# Patient Record
Sex: Male | Born: 1955
Health system: Southern US, Community
[De-identification: ages and names within clinical notes are randomized; demographics above are authoritative.]

## PROBLEM LIST (undated history)

## (undated) DIAGNOSIS — I10 Essential (primary) hypertension: Secondary | ICD-10-CM

## (undated) DIAGNOSIS — F17211 Nicotine dependence, cigarettes, in remission: Secondary | ICD-10-CM

## (undated) DIAGNOSIS — Z86010 Personal history of colonic polyps: Secondary | ICD-10-CM

## (undated) DIAGNOSIS — Z9109 Other allergy status, other than to drugs and biological substances: Secondary | ICD-10-CM

## (undated) DIAGNOSIS — N529 Male erectile dysfunction, unspecified: Secondary | ICD-10-CM

## (undated) DIAGNOSIS — R7303 Prediabetes: Secondary | ICD-10-CM

## (undated) DIAGNOSIS — E66811 Obesity, class 1: Secondary | ICD-10-CM

## (undated) DIAGNOSIS — F17200 Nicotine dependence, unspecified, uncomplicated: Secondary | ICD-10-CM

## (undated) DIAGNOSIS — K5792 Diverticulitis of intestine, part unspecified, without perforation or abscess without bleeding: Secondary | ICD-10-CM

## (undated) DIAGNOSIS — E669 Obesity, unspecified: Secondary | ICD-10-CM

## (undated) DIAGNOSIS — R7989 Other specified abnormal findings of blood chemistry: Secondary | ICD-10-CM

## (undated) DIAGNOSIS — E559 Vitamin D deficiency, unspecified: Secondary | ICD-10-CM

## (undated) DIAGNOSIS — E785 Hyperlipidemia, unspecified: Secondary | ICD-10-CM

## (undated) DIAGNOSIS — J449 Chronic obstructive pulmonary disease, unspecified: Secondary | ICD-10-CM

## (undated) DIAGNOSIS — T7840XA Allergy, unspecified, initial encounter: Secondary | ICD-10-CM

## (undated) DIAGNOSIS — G709 Myoneural disorder, unspecified: Secondary | ICD-10-CM

## (undated) HISTORY — DX: Myoneural disorder, unspecified: G70.9

## (undated) HISTORY — DX: Other allergy status, other than to drugs and biological substances: Z91.09

## (undated) HISTORY — DX: Essential (primary) hypertension: I10

## (undated) HISTORY — DX: Nicotine dependence, unspecified, uncomplicated: F17.200

## (undated) HISTORY — DX: Allergy, unspecified, initial encounter: T78.40XA

## (undated) HISTORY — DX: Hyperlipidemia, unspecified: E78.5

## (undated) HISTORY — DX: Obesity, unspecified: E66.9

## (undated) HISTORY — DX: Vitamin D deficiency, unspecified: E55.9

## (undated) HISTORY — DX: Prediabetes: R73.03

## (undated) HISTORY — PX: COLONOSCOPY: SHX174

## (undated) HISTORY — DX: Other specified abnormal findings of blood chemistry: R79.89

## (undated) HISTORY — DX: Obesity, class 1: E66.811

## (undated) HISTORY — DX: Chronic obstructive pulmonary disease, unspecified: J44.9

## (undated) HISTORY — DX: Nicotine dependence, cigarettes, in remission: F17.211

## (undated) HISTORY — DX: Diverticulitis of intestine, part unspecified, without perforation or abscess without bleeding: K57.92

## (undated) HISTORY — DX: Male erectile dysfunction, unspecified: N52.9

## (undated) HISTORY — PX: VASECTOMY: SHX75

---
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
## 2003-11-07 HISTORY — PX: COLON SURGERY: SHX602

## 2008-12-16 LAB — HM COLONOSCOPY

## 2008-12-18 HISTORY — PX: COLONOSCOPY: SHX174

## 2015-03-02 DIAGNOSIS — I10 Essential (primary) hypertension: Secondary | ICD-10-CM | POA: Insufficient documentation

## 2015-03-02 DIAGNOSIS — E7800 Pure hypercholesterolemia, unspecified: Secondary | ICD-10-CM | POA: Insufficient documentation

## 2016-09-02 LAB — VITAMIN B12: Vitamin B-12: 759

## 2017-05-29 DIAGNOSIS — E663 Overweight: Secondary | ICD-10-CM | POA: Insufficient documentation

## 2017-05-29 DIAGNOSIS — F17211 Nicotine dependence, cigarettes, in remission: Secondary | ICD-10-CM | POA: Insufficient documentation

## 2017-05-29 LAB — CBC AND DIFFERENTIAL
HCT: 44 (ref 41–53)
Hemoglobin: 15 (ref 13.5–17.5)
Platelets: 151 (ref 150–399)
WBC: 5.1

## 2017-05-29 LAB — BASIC METABOLIC PANEL
BUN: 11 (ref 4–21)
Creatinine: 0.8 (ref <=1.3)
Glucose: 90
Potassium: 4.5 (ref 3.4–5.3)
Sodium: 141 (ref 137–147)

## 2017-05-29 LAB — HEPATIC FUNCTION PANEL
ALT: 30 (ref 10–40)
AST: 22 (ref 14–40)
Alkaline Phosphatase: 55 (ref 25–125)
Bilirubin, Total: 0.7

## 2017-05-29 LAB — IRON,TIBC AND FERRITIN PANEL
Ferritin: 420
Iron: 96

## 2017-05-29 LAB — LIPID PANEL
Cholesterol: 175 (ref 0–200)
HDL: 52 (ref 35–70)
LDL Cholesterol: 97
Triglycerides: 128 (ref 40–160)

## 2017-05-29 LAB — HEMOGLOBIN A1C: Hemoglobin A1C: 5.8

## 2017-12-21 ENCOUNTER — Ambulatory Visit: Payer: PRIVATE HEALTH INSURANCE | Admitting: Family Medicine

## 2017-12-21 ENCOUNTER — Encounter: Payer: Self-pay | Admitting: Family Medicine

## 2017-12-21 VITALS — BP 140/77 | HR 75 | Temp 98.5°F | Resp 16 | Ht 67.0 in | Wt 200.0 lb

## 2017-12-21 DIAGNOSIS — Z0000 Encounter for general adult medical examination without abnormal findings: Secondary | ICD-10-CM | POA: Diagnosis not present

## 2017-12-21 DIAGNOSIS — E7800 Pure hypercholesterolemia, unspecified: Secondary | ICD-10-CM | POA: Diagnosis not present

## 2017-12-21 DIAGNOSIS — K5790 Diverticulosis of intestine, part unspecified, without perforation or abscess without bleeding: Secondary | ICD-10-CM | POA: Insufficient documentation

## 2017-12-21 DIAGNOSIS — I10 Essential (primary) hypertension: Secondary | ICD-10-CM | POA: Diagnosis not present

## 2017-12-21 DIAGNOSIS — N5201 Erectile dysfunction due to arterial insufficiency: Secondary | ICD-10-CM

## 2017-12-21 DIAGNOSIS — R7303 Prediabetes: Secondary | ICD-10-CM

## 2017-12-21 DIAGNOSIS — F17201 Nicotine dependence, unspecified, in remission: Secondary | ICD-10-CM | POA: Diagnosis not present

## 2017-12-21 DIAGNOSIS — E559 Vitamin D deficiency, unspecified: Secondary | ICD-10-CM | POA: Insufficient documentation

## 2017-12-21 DIAGNOSIS — R7301 Impaired fasting glucose: Secondary | ICD-10-CM | POA: Insufficient documentation

## 2017-12-21 LAB — CBC WITH DIFFERENTIAL/PLATELET
Basophils Absolute: 0 10*3/uL (ref 0.0–0.1)
Basophils Relative: 0.6 % (ref 0.0–3.0)
Eosinophils Absolute: 0.1 10*3/uL (ref 0.0–0.7)
Eosinophils Relative: 1.8 % (ref 0.0–5.0)
HCT: 42.3 % (ref 39.0–52.0)
Hemoglobin: 14.7 g/dL (ref 13.0–17.0)
Lymphocytes Relative: 36.1 % (ref 12.0–46.0)
Lymphs Abs: 2 10*3/uL (ref 0.7–4.0)
MCHC: 34.7 g/dL (ref 30.0–36.0)
MCV: 91.6 fl (ref 78.0–100.0)
Monocytes Absolute: 0.5 10*3/uL (ref 0.1–1.0)
Monocytes Relative: 9.2 % (ref 3.0–12.0)
Neutro Abs: 2.9 10*3/uL (ref 1.4–7.7)
Neutrophils Relative %: 52.3 % (ref 43.0–77.0)
Platelets: 171 10*3/uL (ref 150.0–400.0)
RBC: 4.62 Mil/uL (ref 4.22–5.81)
RDW: 13.6 % (ref 11.5–15.5)
WBC: 5.6 10*3/uL (ref 4.0–10.5)

## 2017-12-21 LAB — COMPREHENSIVE METABOLIC PANEL
ALT: 25 U/L (ref 0–53)
AST: 19 U/L (ref 0–37)
Albumin: 4.6 g/dL (ref 3.5–5.2)
Alkaline Phosphatase: 43 U/L (ref 39–117)
BUN: 13 mg/dL (ref 6–23)
CO2: 31 mEq/L (ref 19–32)
Calcium: 9.3 mg/dL (ref 8.4–10.5)
Chloride: 101 mEq/L (ref 96–112)
Creatinine, Ser: 0.88 mg/dL (ref 0.40–1.50)
GFR: 93.08 mL/min (ref >60.00)
Glucose, Bld: 100 mg/dL — ABNORMAL HIGH (ref 70–99)
Potassium: 4.5 mEq/L (ref 3.5–5.1)
Sodium: 140 mEq/L (ref 135–145)
Total Bilirubin: 1 mg/dL (ref 0.2–1.2)
Total Protein: 7.1 g/dL (ref 6.0–8.3)

## 2017-12-21 LAB — LIPID PANEL
Cholesterol: 192 mg/dL (ref 0–200)
HDL: 48.3 mg/dL (ref >39.00)
LDL Cholesterol: 110 mg/dL — ABNORMAL HIGH (ref 0–99)
NonHDL: 143.2
Total CHOL/HDL Ratio: 4
Triglycerides: 165 mg/dL — ABNORMAL HIGH (ref 0.0–149.0)
VLDL: 33 mg/dL (ref 0.0–40.0)

## 2017-12-21 LAB — HEMOGLOBIN A1C: Hgb A1c MFr Bld: 5.9 % (ref 4.6–6.5)

## 2017-12-21 LAB — TSH: TSH: 1.73 u[IU]/mL (ref 0.35–4.50)

## 2017-12-21 MED ORDER — TELMISARTAN 40 MG PO TABS: 40.0000 mg | ORAL_TABLET | Freq: Every day | ORAL | 3 refills | Status: DC

## 2017-12-21 MED ORDER — PRAVASTATIN SODIUM 40 MG PO TABS: 80.0000 mg | ORAL_TABLET | Freq: Every day | ORAL | 3 refills | Status: DC

## 2017-12-21 MED ORDER — SILDENAFIL CITRATE 20 MG PO TABS: ORAL_TABLET | ORAL | 6 refills | Status: AC

## 2017-12-21 NOTE — Progress Notes (Signed)
Office Note 12/21/2017  CC:  Chief Complaint  Patient presents with  . Establish Care  . Follow-up    RCI, needs medication refills   HPI:  Saw Todd Barry is a 62 y.o. male who is here to establish care and f/u HTN, HLD--needs med refills. Patient's most recent primary MD: Dr. Amedeo Plenty in [place]. Old records in EPIC/EMR were reviewed prior to or during today's visit.  HTN: no home monitoring.  Says BP is "great".  Takes med daily.  HLD: tolerating statin daily.  He reports few yrs hx of inability to get a complete erection, or loses erection quickly when he does get a normal erection.  Has tried a friend's viagra and says it helped and he had no side effect. He does not recall the dose he took.   Past Medical History:  Diagnosis Date  . Diverticulitis   . Hyperlipidemia   . Hypertension   . Obesity, Class I, BMI 30-34.9     Past Surgical History:  Procedure Laterality Date  . COLON SURGERY  2005   Partial resection (approx 1 foot per pt) for diverticulitis  Colonoscopy--last one approx 2014 Pt states it was normal). Last Td unknown.  Family History  Problem Relation Age of Onset  . Diabetes Mother   . Hypertension Mother   . Heart disease Father   . Heart attack Father     Social History   Socioeconomic History  . Marital status: Married    Spouse name: Not on file  . Number of children: Not on file  . Years of education: Not on file  . Highest education level: Not on file  Social Needs  . Financial resource strain: Not on file  . Food insecurity - worry: Not on file  . Food insecurity - inability: Not on file  . Transportation needs - medical: Not on file  . Transportation needs - non-medical: Not on file  Occupational History  . Not on file  Tobacco Use  . Smoking status: Former Smoker    Packs/day: 2.00    Years: 42.00    Pack years: 84.00    Types: Cigarettes    Last attempt to quit: 05/15/2017    Years since quitting: 0.6  . Smokeless  tobacco: Never Used  Substance and Sexual Activity  . Alcohol use: Yes    Comment: occiaionally  . Drug use: No  . Sexual activity: Not on file  Other Topics Concern  . Not on file  Social History Narrative   Married, 4 children---two in their 25s, 2 younger.   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   Tob: former smoker, 50 pack-yr hx.  Quit summer 2018.   Alc: none   Exercise: likes to run 2-3 miles on treadmill regularly, and play tennis.    Outpatient Encounter Medications as of 12/21/2017  Medication Sig  . aspirin 81 MG tablet Take 1 tablet by mouth daily.  Marland Kitchen FIBER PO Take 3 capsules by mouth daily.  Marland Kitchen ibuprofen (ADVIL,MOTRIN) 200 MG tablet Take 1 tablet by mouth every 6 (six) hours as needed.  Marland Kitchen MISC NATURAL PRODUCTS PO Take 1 capsule by mouth daily. Healthy Brain  . Omega-3 Fatty Acids (OMEGA-3 FISH OIL) 1200 MG CAPS Take 1 capsule by mouth 2 (two) times daily.  . pravastatin (PRAVACHOL) 40 MG tablet Take 2 tablets (80 mg total) by mouth daily.  Marland Kitchen telmisartan (MICARDIS) 40 MG tablet Take 1 tablet (40 mg total)  by mouth daily.  . [DISCONTINUED] pravastatin (PRAVACHOL) 40 MG tablet Take 2 tablets by mouth daily.  . [DISCONTINUED] telmisartan (MICARDIS) 40 MG tablet Take 1 tablet by mouth daily.  . sildenafil (REVATIO) 20 MG tablet 1-5 tabs po qd prn.  Take 30-60 min prior to intercourse   No facility-administered encounter medications on file as of 12/21/2017.     Allergies  Allergen Reactions  . Ace Inhibitors     Cough  . Penicillins Rash    ROS Review of Systems  Constitutional: Negative for fatigue and fever.  HENT: Negative for congestion and sore throat.   Eyes: Negative for visual disturbance.  Respiratory: Negative for cough.   Cardiovascular: Negative for chest pain.  Gastrointestinal: Negative for abdominal pain and nausea.  Genitourinary: Negative for dysuria.  Musculoskeletal: Negative for back pain and joint swelling.   Skin: Negative for rash.  Neurological: Negative for weakness and headaches.  Hematological: Negative for adenopathy.    PE; Blood pressure 140/77, pulse 75, temperature 98.5 F (36.9 C), temperature source Oral, resp. rate 16, height 5\' 7"  (1.702 m), weight 200 lb (90.7 kg), SpO2 90 %. Body mass index is 31.32 kg/m.  Gen: Alert, well appearing.  Patient is oriented to person, place, time, and situation. AFFECT: pleasant, lucid thought and speech. TFT:DDUK: no injection, icteris, swelling, or exudate.  EOMI, PERRLA. Mouth: lips without lesion/swelling.  Oral mucosa pink and moist. Oropharynx without erythema, exudate, or swelling.  CV: RRR, no m/r/g.   LUNGS: CTA bilat, nonlabored resps, good aeration in all lung fields. ABD: soft, NT/ND EXT: no clubbing, cyanosis, or edema.  SKIN: no rash, no jaundice, no pallor.  Pertinent labs:  No results found for: TSH No results found for: WBC, HGB, HCT, MCV, PLT No results found for: CREATININE, BUN, NA, K, CL, CO2 No results found for: ALT, AST, GGT, ALKPHOS, BILITOT No results found for: CHOL No results found for: HDL No results found for: LDLCALC No results found for: TRIG No results found for: CHOLHDL No results found for: PSA  ASSESSMENT AND PLAN:   New Pt: prior PCP's records in our EMR.  1) HTN: The current medical regimen is effective;  continue present plan and medications. Lytes/cr today. CBC and TSH today as well.  2) HLD: tolerating statin daily.  Check FLP and hepatic panel today.  3) Erectile dysfunction: viagra 20mg , 1-5 qd prn, #100 (quantity per pt's request). Therapeutic expectations and side effect profile of medication discussed today.  Patient's questions answered.  4) hx of tobacco dependence: quit approx 6-8 mo ago.  Has put on some wt since quitting BUT feels MUCH improved breathing with exercise (running/tennis).  5) Prediabetes: he is trying to adjust diet some, is exercising regularly. Checking  fasting glucose and A1c today.  6) Preventative health care:  Pt states he had a polyp-free colonoscopy about 4 yrs ago. Pt declines flu vaccine. Unknown date of last Td.  An After Visit Summary was printed and given to the patient.  Return in about 6 months (around 06/20/2018) for annual CPE (fasting).  Signed:  Crissie Sickles, MD           12/21/2017

## 2018-01-22 ENCOUNTER — Encounter: Payer: Self-pay | Admitting: Family Medicine

## 2018-01-24 ENCOUNTER — Encounter: Payer: Self-pay | Admitting: *Deleted

## 2018-01-24 ENCOUNTER — Encounter: Payer: Self-pay | Admitting: Family Medicine

## 2018-01-25 ENCOUNTER — Telehealth: Payer: Self-pay | Admitting: *Deleted

## 2018-01-25 ENCOUNTER — Encounter: Payer: Self-pay | Admitting: Family Medicine

## 2018-01-25 NOTE — Telephone Encounter (Signed)
Received medical records from [facility].  I reviewed records and abstracted information into pts chart.   Records have been placed on Dr. Idelle Leech desk for review.

## 2018-01-29 ENCOUNTER — Ambulatory Visit: Payer: PRIVATE HEALTH INSURANCE | Admitting: Family Medicine

## 2018-01-29 ENCOUNTER — Encounter: Payer: Self-pay | Admitting: Family Medicine

## 2018-01-29 VITALS — BP 107/74 | HR 58 | Temp 98.4°F | Resp 16 | Ht 67.0 in | Wt 199.4 lb

## 2018-01-29 DIAGNOSIS — H8110 Benign paroxysmal vertigo, unspecified ear: Secondary | ICD-10-CM | POA: Diagnosis not present

## 2018-01-29 MED ORDER — MECLIZINE HCL 25 MG PO TABS: 25.0000 mg | ORAL_TABLET | Freq: Three times a day (TID) | ORAL | 1 refills | Status: DC | PRN

## 2018-01-29 NOTE — Progress Notes (Signed)
OFFICE VISIT  01/29/2018   CC:  Chief Complaint  Patient presents with  . Dizziness    lasted for about 2.5 days, was seen at an urgent care, Rxed meclizine     HPI:    Patient is a 62 y.o. Caucasian male presents accompanied by his wife complaint of "dizziness". One week ago was feeling in normal state of health, was in [place] visiting, felt onset of distinct vertigo sensation. Was spontaneous, no known preceding head turning that triggered it.  Unclear how long the actual vertigo sensation lasted--sounds like a minute or two of signif sensation, then abated to a disequilibrium sensation..  Says some form of dizzy feeling lasted hours and then to the next day.  No nausea.  Went to UC next day, says no dx given. Still feeling some heavy headed feeling in the days since.  No hearing loss or ringing in ears.  "Fuzzy feeling in head". No further episodes of distinct vertigo. No fall.  No CP, SOB, diaphoresis.  No focal weakness, no slurred speech.  No palpitations.  BP at urgent care normal per pt report.  [facility] at [facility] (reviewed by me today) showed sinus rhythm, rate 65, isolated low voltage and inverted T wave in III, low voltage aVF.  No acute ischemic changes, no hypertrophy, no ectopy.  Intervals and duration normal.  No prior EKG available for comparison. No bp taken during this time except yesterday: 132/78.  Has been working out for the last mo w/out CP.  In [place] he walked a lot.  He was in [place] for work--+stressful.  Past Medical History:  Diagnosis Date  . Diverticulitis   . ED (erectile dysfunction)   . Elevated ferritin   . Hyperlipidemia   . Hypertension   . Nicotine dependence, cigarettes, in remission   . Obesity, Class I, BMI 30-34.9   . Prediabetes    A1c 5.9% 2019  . Vitamin D deficiency     Past Surgical History:  Procedure Laterality Date  . COLON SURGERY  2005   Partial resection (approx 1 foot per pt) for diverticulitis  . COLONOSCOPY  12/18/2008  . VASECTOMY       Outpatient Medications Prior to Visit  Medication Sig Dispense Refill  . aspirin 81 MG tablet Take 1 tablet by mouth daily.    Marland Kitchen FIBER PO Take 3 capsules by mouth daily.    Marland Kitchen ibuprofen (ADVIL,MOTRIN) 200 MG tablet Take 1 tablet by mouth every 6 (six) hours as needed.    Marland Kitchen MISC NATURAL PRODUCTS PO Take 1 capsule by mouth daily. Healthy Brain    . Omega-3 Fatty Acids (OMEGA-3 FISH OIL) 1200 MG CAPS Take 1 capsule by mouth 2 (two) times daily.    . pravastatin (PRAVACHOL) 40 MG tablet Take 2 tablets (80 mg total) by mouth daily. 180 tablet 3  . sildenafil (REVATIO) 20 MG tablet 1-5 tabs po qd prn.  Take 30-60 min prior to intercourse 100 tablet 6  . telmisartan (MICARDIS) 40 MG tablet Take 1 tablet (40 mg total) by mouth daily. 90 tablet 3   No facility-administered medications prior to visit.     Allergies  Allergen Reactions  . Ace Inhibitors     Cough  . Penicillins Rash    ROS As per HPI  PE: Blood pressure 107/74, pulse (!) 58, temperature 98.4 F (36.9 C), temperature source Oral, resp. rate 16, height 5\' 7"  (1.702 m), weight 199 lb 6 oz (90.4 kg), SpO2 97 %. Gen: Alert,  well appearing.  Patient is oriented to person, place, time, and situation. AFFECT: pleasant, lucid thought and speech. DSK:AJGO: no injection, icteris, swelling, or exudate.  EOMI, PERRLA. Mouth: lips without lesion/swelling.  Oral mucosa pink and moist. Oropharynx without erythema, exudate, or swelling.  CV: RRR, no m/r/g.   LUNGS: CTA bilat, nonlabored resps, good aeration in all lung fields. EXT: no clubbing, cyanosis, or edema.  Neuro: CN 2-12 intact bilaterally, strength 5/5 in proximal and distal upper extremities and lower extremities bilaterally. No tremor.  No disdiadochokinesis.  No ataxia.  Upper extremity and lower extremity DTRs symmetric.  No pronator drift. Dix-Halpike: no vertigo or nystagmus elicited at all.   LABS:    Chemistry      Component Value Date/Time   NA 140 12/21/2017  1347   NA 141 05/29/2017   K 4.5 12/21/2017 1347   CL 101 12/21/2017 1347   CO2 31 12/21/2017 1347   BUN 13 12/21/2017 1347   BUN 11 05/29/2017   CREATININE 0.88 12/21/2017 1347   GLU 90 05/29/2017      Component Value Date/Time   CALCIUM 9.3 12/21/2017 1347   ALKPHOS 43 12/21/2017 1347   AST 19 12/21/2017 1347   ALT 25 12/21/2017 1347   BILITOT 1.0 12/21/2017 1347     Lab Results  Component Value Date   WBC 5.6 12/21/2017   HGB 14.7 12/21/2017   HCT 42.3 12/21/2017   MCV 91.6 12/21/2017   PLT 171.0 12/21/2017   Lab Results  Component Value Date   HGBA1C 5.9 12/21/2017    IMPRESSION AND PLAN:  Vertigo; suspect BPPV. No sign of ominous neurologic condition or CV condition. Discussed dx in depth. Use meclizine tid prn. Home epley maneuvers discussed, handout given.  Spent 25 min with pt today, with >50% of this time spent in counseling and care coordination regarding the above problems.   An After Visit Summary was printed and given to the patient.  FOLLOW UP: Return if symptoms worsen or fail to improve.   Signed:  Crissie Sickles, MD           01/29/2018

## 2018-02-20 ENCOUNTER — Encounter: Payer: Self-pay | Admitting: Family Medicine

## 2018-02-20 ENCOUNTER — Ambulatory Visit: Payer: PRIVATE HEALTH INSURANCE | Admitting: Family Medicine

## 2018-02-20 VITALS — BP 149/82 | HR 71 | Temp 98.2°F | Resp 20 | Ht 67.0 in | Wt 200.0 lb

## 2018-02-20 DIAGNOSIS — S0502XA Injury of conjunctiva and corneal abrasion without foreign body, left eye, initial encounter: Secondary | ICD-10-CM | POA: Diagnosis not present

## 2018-02-20 MED ORDER — LEVOFLOXACIN 500 MG PO TABS: 500.0000 mg | ORAL_TABLET | Freq: Every day | ORAL | 0 refills | Status: DC

## 2018-02-20 MED ORDER — ERYTHROMYCIN 5 MG/GM OP OINT: 1.0000 "application " | TOPICAL_OINTMENT | Freq: Three times a day (TID) | OPHTHALMIC | 0 refills | Status: DC

## 2018-02-20 NOTE — Patient Instructions (Signed)
Start ointment every 8 hours, flushing with saline prior to placing ointment.  Start oral antibiotic once daily for 7 days.   If worsening in 24 hours go to ED. If not improving in 24-48 hours go to ED.   Follow up with PCP in 1 week.     Corneal Abrasion A corneal abrasion is a scratch or injury to the clear covering over the front of your eye (cornea). This can be painful. It is important to get treatment for a corneal abrasion. If this problem is not treated, it can affect your eyesight (vision). Follow these instructions at home: Medicines  Use eye drops or ointments as told by your doctor.  If you were prescribed antibiotic drops or ointment, use them as told by your doctor. Do not stop using the antibiotic even if you start to feel better.  Take over-the-counter and prescription medicines only as told by your doctor.  Do not drive or use heavy machinery while taking prescription pain medicine. General instructions  If you have an eye patch, wear it as told by your doctor. ? Do not drive or use machinery while wearing an eye patch. ? Follow instructions from your doctor about when to take off the patch.  Ask your doctor if you can use a cold, wet cloth (compress) on your eye to help with pain.  Do not rub or touch your eye. Do not wash out your eye.  Do not wear contact lenses until your doctor says that this is okay.  Avoid bright light.  Avoid straining your eyes.  Keep all follow-up visits as told by your doctor. Doing this can help to prevent infection and loss of eyesight. Contact a doctor if:  You continue to have eye pain and other symptoms for more than 2 days.  You get new symptoms, such as: ? Redness. ? Watery eyes (tearing). ? Discharge.  You have discharge that makes your eyelids stick together in the morning.  Symptoms come back after your eye heals. Get help right away if:  You have very bad eye pain that does not get better with medicine.  You  lose eyesight. Summary  A corneal abrasion is a scratch or injury to the clear covering over the front of your eye (cornea).  It is important to get treatment for a corneal abrasion. If this problem is not treated, it can affect your eyesight (vision).  Use eye drops or ointments as told by your doctor.  If you have an eye patch, do not drive or use machinery while wearing it. This information is not intended to replace advice given to you by your health care provider. Make sure you discuss any questions you have with your health care provider. Document Released: 04/10/2008 Document Revised: 10/07/2016 Document Reviewed: 10/07/2016 Elsevier Interactive Patient Education  2017 [employer].

## 2018-02-20 NOTE — Progress Notes (Signed)
Todd Barry , [birth year], 62 y.o., male MRN: [id number] Patient Care Team    Relationship Specialty Notifications Start End  McGowen, Adrian Blackwater, MD PCP - General Family Medicine  12/21/17     Chief Complaint  Patient presents with  . Foreign Body in [place]    left happened yesterday     Subjective: Pt presents for an OV with complaints of left eye irritation of 1 day  duration.  Associated symptoms include redness and watery eye. No pain.  He does not have an eye doctor and he does not wear contacts. He reports he was out working around [employer], [employer]. He reports he did not feel anything fly into the eye during the cutting. The watery, irritated feeling in his eye started a few hours after the cutting of wood. He feels like it is irritated and something is in his eye. He flushed his eyes out today with saline, but it is still watery and red. He also has mild swelling of upper lid. He deneis fever or chills. Drainage is watery and clear. Vision is unchanged. No light sensitivity.   Depression screen PHQ 2/9 12/21/2017  Decreased Interest 0  Down, Depressed, Hopeless 0  PHQ - 2 Score 0    Allergies  Allergen Reactions  . Ace Inhibitors     Cough  . Penicillins Rash   Social History   Tobacco Use  . Smoking status: Former Smoker    Packs/day: 2.00    Years: 42.00    Pack years: 84.00    Types: Cigarettes    Last attempt to quit: 05/15/2017    Years since quitting: 0.7  . Smokeless tobacco: Never Used  Substance Use Topics  . Alcohol use: Yes    Comment: occiaionally   Past Medical History:  Diagnosis Date  . Diverticulitis   . ED (erectile dysfunction)   . Elevated ferritin   . Hyperlipidemia   . Hypertension   . Nicotine dependence, cigarettes, in remission   . Obesity, Class I, BMI 30-34.9   . Prediabetes    A1c 5.9% 2019  . Vitamin D deficiency    Past Surgical History:  Procedure Laterality Date  . COLON SURGERY  2005   Partial resection (approx  1 foot per pt) for diverticulitis  . COLONOSCOPY  12/18/2008  . VASECTOMY     Family History  Problem Relation Age of Onset  . Diabetes Mother   . Hypertension Mother   . Heart disease Father   . Heart attack Father   . Breast cancer Sister   . Hypertension Brother   . Diabetes Daughter   . Vision loss Daughter    Allergies as of 02/20/2018      Reactions   Ace Inhibitors    Cough   Penicillins Rash      Medication List        Accurate as of 02/20/18  3:37 PM. Always use your most recent med list.          aspirin 81 MG tablet Take 1 tablet by mouth daily.   FIBER PO Take 3 capsules by mouth daily.   ibuprofen 200 MG tablet Commonly known as:  ADVIL,MOTRIN Take 1 tablet by mouth every 6 (six) hours as needed.   meclizine 25 MG tablet Commonly known as:  ANTIVERT Take 1 tablet (25 mg total) by mouth 3 (three) times daily as needed for dizziness.   MISC NATURAL PRODUCTS PO Take 1 capsule by mouth daily. Healthy  Brain   Omega-3 Fish Oil 1200 MG Caps Take 1 capsule by mouth 2 (two) times daily.   pravastatin 40 MG tablet Commonly known as:  PRAVACHOL Take 2 tablets (80 mg total) by mouth daily.   sildenafil 20 MG tablet Commonly known as:  REVATIO 1-5 tabs po qd prn.  Take 30-60 min prior to intercourse   telmisartan 40 MG tablet Commonly known as:  MICARDIS Take 1 tablet (40 mg total) by mouth daily.       All past medical history, surgical history, allergies, family history, immunizations andmedications were updated in the EMR today and reviewed under the history and medication portions of their EMR.     ROS: Negative, with the exception of above mentioned in HPI   Objective:  BP (!) 149/82 (BP Location: Left Arm, Patient Position: Sitting, Cuff Size: Normal)   Pulse 71   Temp 98.2 F (36.8 C)   Resp 20   Ht 5\' 7"  (1.702 m)   Wt 200 lb (90.7 kg)   SpO2 97%   BMI 31.32 kg/m  Body mass index is 31.32 kg/m. Gen: Afebrile. No acute distress.  Nontoxic in appearance, well developed, well nourished.  Eyes:Pupils Equal Round Reactive to light, Extraocular movements intact,  left Conjunctiva with redness, watery discharge present. No photophobia. No pain to external globe pressure. No foreign body visualized. fluorescein and woods lamp identified small lateral corneal abrasion. Upper eyelid with some mild swelling. No visual changes. Neuro: Normal gait. PERLA. EOMi. Alert. Oriented x3   No exam data present No results found. No results found for this or any previous visit (from the past 24 hour(s)).  Assessment/Plan: Todd Barry is a 62 y.o. male present for OV for  Abrasion of left cornea, initial encounter - small corneal abrasion left lateral cornea identified under fluorescin/woods lamp inspection. Did not identify a FB. He does have some mild swelling upper eyelid, hopefull from rubbing, but can not rule out infection.  - Discussed with pt our tools are limited for eye exams.  - Treat with erythromycin ointment and short course of oral LQ (concern over possible infection orbit). However he will need to be closely monitored. He was given strict instructions today verbally and written on AVS.  - Start ointment every 8 hours, flushing with saline prior to placing ointment.  Start oral antibiotic once daily for 7 days.  If worsening in 24 hours go to ED. If not improving in 24-48 hours go to ED. If develops fever, chills or visual changes--> go to ED. Pt voiced understanding.  Follow up with PCP in 1 week.     Reviewed expectations re: course of current medical issues.  Discussed self-management of symptoms.  Outlined signs and symptoms indicating need for more acute intervention.  Patient verbalized understanding and all questions were answered.  Patient received an After-Visit Summary.    No orders of the defined types were placed in this encounter.    Note is dictated utilizing voice recognition software.  Although note has been proof read prior to signing, occasional typographical errors still can be missed. If any questions arise, please do not hesitate to call for verification.   electronically signed by:  Howard Pouch, DO  [place]

## 2018-05-22 ENCOUNTER — Other Ambulatory Visit: Payer: Self-pay | Admitting: Family Medicine

## 2018-05-22 NOTE — Telephone Encounter (Signed)
Pharmacy is request to change BP medication telmisartan to losartan because telmisartan is on back order.   Please advise. Thanks.

## 2018-05-22 NOTE — Telephone Encounter (Signed)
OK to change, so I'll authorize this rx, but pls call pt and make sure he is aware of the situation. Monitor bp and call or return if not consistently <140/90 on the losartan.-thx

## 2018-05-23 NOTE — Telephone Encounter (Signed)
Pt advised and voiced understanding.   

## 2018-06-19 ENCOUNTER — Encounter: Payer: PRIVATE HEALTH INSURANCE | Admitting: Family Medicine

## 2018-06-26 ENCOUNTER — Encounter: Payer: PRIVATE HEALTH INSURANCE | Admitting: Family Medicine

## 2018-06-27 ENCOUNTER — Encounter: Payer: Self-pay | Admitting: Family Medicine

## 2018-06-27 ENCOUNTER — Ambulatory Visit: Payer: PRIVATE HEALTH INSURANCE | Admitting: Family Medicine

## 2018-06-27 VITALS — BP 132/74 | HR 64 | Temp 98.3°F | Resp 16 | Ht 67.0 in | Wt 199.0 lb

## 2018-06-27 DIAGNOSIS — E039 Hypothyroidism, unspecified: Secondary | ICD-10-CM | POA: Diagnosis not present

## 2018-06-27 DIAGNOSIS — R7301 Impaired fasting glucose: Secondary | ICD-10-CM

## 2018-06-27 DIAGNOSIS — Z125 Encounter for screening for malignant neoplasm of prostate: Secondary | ICD-10-CM | POA: Diagnosis not present

## 2018-06-27 DIAGNOSIS — Z0000 Encounter for general adult medical examination without abnormal findings: Secondary | ICD-10-CM

## 2018-06-27 DIAGNOSIS — E7800 Pure hypercholesterolemia, unspecified: Secondary | ICD-10-CM | POA: Diagnosis not present

## 2018-06-27 DIAGNOSIS — I10 Essential (primary) hypertension: Secondary | ICD-10-CM | POA: Diagnosis not present

## 2018-06-27 DIAGNOSIS — E669 Obesity, unspecified: Secondary | ICD-10-CM

## 2018-06-27 DIAGNOSIS — Z23 Encounter for immunization: Secondary | ICD-10-CM

## 2018-06-27 LAB — COMPREHENSIVE METABOLIC PANEL
ALT: 31 U/L (ref 0–53)
AST: 23 U/L (ref 0–37)
Albumin: 4.6 g/dL (ref 3.5–5.2)
Alkaline Phosphatase: 49 U/L (ref 39–117)
BUN: 13 mg/dL (ref 6–23)
CO2: 31 mEq/L (ref 19–32)
Calcium: 9.5 mg/dL (ref 8.4–10.5)
Chloride: 102 mEq/L (ref 96–112)
Creatinine, Ser: 0.91 mg/dL (ref 0.40–1.50)
GFR: 89.4 mL/min (ref >60.00)
Glucose, Bld: 119 mg/dL — ABNORMAL HIGH (ref 70–99)
Potassium: 4.6 mEq/L (ref 3.5–5.1)
Sodium: 139 mEq/L (ref 135–145)
Total Bilirubin: 0.7 mg/dL (ref 0.2–1.2)
Total Protein: 7.2 g/dL (ref 6.0–8.3)

## 2018-06-27 LAB — LIPID PANEL
Cholesterol: 184 mg/dL (ref 0–200)
HDL: 53.6 mg/dL (ref >39.00)
LDL Cholesterol: 102 mg/dL — ABNORMAL HIGH (ref 0–99)
NonHDL: 130.77
Total CHOL/HDL Ratio: 3
Triglycerides: 146 mg/dL (ref 0.0–149.0)
VLDL: 29.2 mg/dL (ref 0.0–40.0)

## 2018-06-27 LAB — CBC WITH DIFFERENTIAL/PLATELET
Basophils Absolute: 0 10*3/uL (ref 0.0–0.1)
Basophils Relative: 0.4 % (ref 0.0–3.0)
Eosinophils Absolute: 0.2 10*3/uL (ref 0.0–0.7)
Eosinophils Relative: 3.1 % (ref 0.0–5.0)
HCT: 42.9 % (ref 39.0–52.0)
Hemoglobin: 14.6 g/dL (ref 13.0–17.0)
Lymphocytes Relative: 38.9 % (ref 12.0–46.0)
Lymphs Abs: 2 10*3/uL (ref 0.7–4.0)
MCHC: 34 g/dL (ref 30.0–36.0)
MCV: 91.8 fl (ref 78.0–100.0)
Monocytes Absolute: 0.5 10*3/uL (ref 0.1–1.0)
Monocytes Relative: 10.9 % (ref 3.0–12.0)
Neutro Abs: 2.3 10*3/uL (ref 1.4–7.7)
Neutrophils Relative %: 46.7 % (ref 43.0–77.0)
Platelets: 173 10*3/uL (ref 150.0–400.0)
RBC: 4.68 Mil/uL (ref 4.22–5.81)
RDW: 13.8 % (ref 11.5–15.5)
WBC: 5 10*3/uL (ref 4.0–10.5)

## 2018-06-27 LAB — PSA: PSA: 0.77 ng/mL (ref 0.10–4.00)

## 2018-06-27 LAB — TSH: TSH: 1.78 u[IU]/mL (ref 0.35–4.50)

## 2018-06-27 LAB — HEMOGLOBIN A1C: Hgb A1c MFr Bld: 5.9 % (ref 4.6–6.5)

## 2018-06-27 NOTE — Addendum Note (Signed)
Addended by: Onalee Hua on: 06/27/2018 09:26 AM   Modules accepted: Orders

## 2018-06-27 NOTE — Progress Notes (Signed)
Office Note 06/27/2018  CC:  Chief Complaint  Patient presents with  . Annual Exam    Pt is fasting.     HPI:  Todd Barry is a 62 y.o. male who is here for annual health maintenance exam. No acute complaints.  He has quit smoking.  Frustrated with wt gain after this.  Exercise: none now.  Does a lot of outdoor work on his property. Diet: admits he doesn't watch what he eats since quitting smoking.   Past Medical History:  Diagnosis Date  . Diverticulitis   . ED (erectile dysfunction)   . Elevated ferritin   . Hyperlipidemia   . Hypertension   . Nicotine dependence, cigarettes, in remission   . Obesity, Class I, BMI 30-34.9   . Prediabetes    A1c 5.9% 2019  . Vitamin D deficiency     Past Surgical History:  Procedure Laterality Date  . COLON SURGERY  2005   Partial resection (approx 1 foot per pt) for diverticulitis  . COLONOSCOPY  12/18/2008  . VASECTOMY      Family History  Problem Relation Age of Onset  . Diabetes Mother   . Hypertension Mother   . Heart disease Father   . Heart attack Father   . Breast cancer Sister   . Hypertension Brother   . Diabetes Daughter   . Vision loss Daughter     Social History   Socioeconomic History  . Marital status: Married    Spouse name: Not on file  . Number of children: Not on file  . Years of education: Not on file  . Highest education level: Not on file  Occupational History  . Not on file  Social Needs  . Financial resource strain: Not on file  . Food insecurity:    Worry: Not on file    Inability: Not on file  . Transportation needs:    Medical: Not on file    Non-medical: Not on file  Tobacco Use  . Smoking status: Former Smoker    Packs/day: 2.00    Years: 42.00    Pack years: 84.00    Types: Cigarettes    Last attempt to quit: 05/15/2017    Years since quitting: 1.1  . Smokeless tobacco: Never Used  Substance and Sexual Activity  . Alcohol use: Yes    Comment: occiaionally  .  Drug use: No  . Sexual activity: Not on file  Lifestyle  . Physical activity:    Days per week: Not on file    Minutes per session: Not on file  . Stress: Not on file  Relationships  . Social connections:    Talks on phone: Not on file    Gets together: Not on file    Attends religious service: Not on file    Active member of club or organization: Not on file    Attends meetings of clubs or organizations: Not on file    Relationship status: Not on file  . Intimate partner violence:    Fear of current or ex partner: Not on file    Emotionally abused: Not on file    Physically abused: Not on file    Forced sexual activity: Not on file  Other Topics Concern  . Not on file  Social History Narrative   Married, 4 children---two in their 30s, 2 younger.   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   Tob: former smoker, 50 pack-yr hx.  Quit summer 2018.   Alc: none   Exercise: likes to run 2-3 miles on treadmill regularly, and play tennis.    Outpatient Medications Prior to Visit  Medication Sig Dispense Refill  . aspirin 81 MG tablet Take 1 tablet by mouth daily.    . Cholecalciferol (VITAMIN D3) 5000 units CAPS Take 1 capsule by mouth daily.    Marland Kitchen FIBER PO Take 3 capsules by mouth daily.    Marland Kitchen ibuprofen (ADVIL,MOTRIN) 200 MG tablet Take 1 tablet by mouth every 6 (six) hours as needed.    Marland Kitchen losartan (COZAAR) 50 MG tablet Take 1 tablet (50 mg total) by mouth daily. 30 tablet 6  . MISC NATURAL PRODUCTS PO Take 1 capsule by mouth daily. Healthy Brain    . Omega-3 Fatty Acids (OMEGA-3 FISH OIL) 1200 MG CAPS Take 1 capsule by mouth 2 (two) times daily.    . pravastatin (PRAVACHOL) 40 MG tablet Take 2 tablets (80 mg total) by mouth daily. 180 tablet 3  . sildenafil (REVATIO) 20 MG tablet 1-5 tabs po qd prn.  Take 30-60 min prior to intercourse 100 tablet 6  . erythromycin ophthalmic ointment Place 1 application into the left eye 3 (three) times daily. (Patient  not taking: Reported on 06/27/2018) 3.5 g 0  . levofloxacin (LEVAQUIN) 500 MG tablet Take 1 tablet (500 mg total) by mouth daily. (Patient not taking: Reported on 06/27/2018) 7 tablet 0  . meclizine (ANTIVERT) 25 MG tablet Take 1 tablet (25 mg total) by mouth 3 (three) times daily as needed for dizziness. (Patient not taking: Reported on 06/27/2018) 30 tablet 1   No facility-administered medications prior to visit.     Allergies  Allergen Reactions  . Ace Inhibitors     Cough  . Penicillins Rash    ROS Review of Systems  Constitutional: Negative for appetite change, chills, fatigue and fever.  HENT: Negative for congestion, dental problem, ear pain and sore throat.   Eyes: Negative for discharge, redness and visual disturbance.  Respiratory: Negative for cough, chest tightness, shortness of breath and wheezing.   Cardiovascular: Negative for chest pain, palpitations and leg swelling.  Gastrointestinal: Negative for abdominal pain, blood in stool, diarrhea, nausea and vomiting.  Genitourinary: Negative for difficulty urinating, dysuria, flank pain, frequency, hematuria and urgency.  Musculoskeletal: Negative for arthralgias, back pain, joint swelling, myalgias and neck stiffness.  Skin: Negative for pallor and rash.  Neurological: Negative for dizziness, speech difficulty, weakness and headaches.  Hematological: Negative for adenopathy. Does not bruise/bleed easily.  Psychiatric/Behavioral: Negative for confusion and sleep disturbance. The patient is not nervous/anxious.     PE; Blood pressure 132/74, pulse 64, temperature 98.3 F (36.8 C), temperature source Oral, resp. rate 16, height 5\' 7"  (1.702 m), weight 199 lb (90.3 kg), SpO2 97 %. Body mass index is 31.17 kg/m.  Gen: Alert, well appearing.  Patient is oriented to person, place, time, and situation. AFFECT: pleasant, lucid thought and speech. ENT: Ears: EACs clear, normal epithelium.  TMs with good light reflex and landmarks  bilaterally.  Eyes: no injection, icteris, swelling, or exudate.  EOMI, PERRLA. Nose: no drainage or turbinate edema/swelling.  No injection or focal lesion.  Mouth: lips without lesion/swelling.  Oral mucosa pink and moist.  Dentition intact and without obvious caries or gingival swelling.  Oropharynx without erythema, exudate, or swelling.  Neck: supple/nontender.  No LAD, mass, or TM.  Carotid pulses 2+ bilaterally, without bruits. CV: RRR, no m/r/g.   LUNGS: CTA bilat, nonlabored  resps, good aeration in all lung fields. ABD: soft, NT, ND, BS normal.  No hepatospenomegaly or mass.  No bruits. EXT: no clubbing, cyanosis, or edema.  Musculoskeletal: no joint swelling, erythema, warmth, or tenderness.  ROM of all joints intact. Skin - no sores or suspicious lesions or rashes or color changes Rectal exam: negative without mass, lesions or tenderness, PROSTATE EXAM: smooth and symmetric without nodules or tenderness.  Pertinent labs:  Lab Results  Component Value Date   TSH 1.73 12/21/2017   Lab Results  Component Value Date   WBC 5.6 12/21/2017   HGB 14.7 12/21/2017   HCT 42.3 12/21/2017   MCV 91.6 12/21/2017   PLT 171.0 12/21/2017   Lab Results  Component Value Date   CREATININE 0.88 12/21/2017   BUN 13 12/21/2017   NA 140 12/21/2017   K 4.5 12/21/2017   CL 101 12/21/2017   CO2 31 12/21/2017   Lab Results  Component Value Date   ALT 25 12/21/2017   AST 19 12/21/2017   ALKPHOS 43 12/21/2017   BILITOT 1.0 12/21/2017   Lab Results  Component Value Date   CHOL 192 12/21/2017   Lab Results  Component Value Date   HDL 48.30 12/21/2017   Lab Results  Component Value Date   LDLCALC 110 (H) 12/21/2017   Lab Results  Component Value Date   TRIG 165.0 (H) 12/21/2017   Lab Results  Component Value Date   CHOLHDL 4 12/21/2017   Lab Results  Component Value Date   HGBA1C 5.9 12/21/2017    ASSESSMENT AND PLAN:   Health maintenance exam: Reviewed age and gender  appropriate health maintenance issues (prudent diet, regular exercise, health risks of tobacco and excessive alcohol, use of seatbelts, fire alarms in home, use of sunscreen).  Also reviewed age and gender appropriate health screening as well as vaccine recommendations. Vaccines: Tdap->> given today.  Shingrix discussed-->#1 given today. Labs: fasting HP, PSA.  Also HbA1c (prediabetes). Prostate ca screening: DRE normal today , PSA. Colon ca screening: next colonoscopy around 12/2018.  An After Visit Summary was printed and given to the patient.  FOLLOW UP:  Return in about 6 months (around 12/28/2018) for annual CPE (fasting).  Signed:  Crissie Sickles, MD           06/27/2018

## 2018-06-27 NOTE — Patient Instructions (Signed)

## 2018-11-21 ENCOUNTER — Other Ambulatory Visit: Payer: Self-pay | Admitting: Family Medicine

## 2018-11-25 ENCOUNTER — Ambulatory Visit: Payer: PRIVATE HEALTH INSURANCE | Admitting: Family Medicine

## 2018-11-25 ENCOUNTER — Encounter: Payer: Self-pay | Admitting: Family Medicine

## 2018-11-25 ENCOUNTER — Encounter: Payer: Self-pay | Admitting: *Deleted

## 2018-11-25 ENCOUNTER — Telehealth: Payer: Self-pay | Admitting: *Deleted

## 2018-11-25 VITALS — BP 135/81 | HR 74 | Temp 98.1°F | Resp 16 | Ht 67.0 in | Wt 178.4 lb

## 2018-11-25 DIAGNOSIS — J209 Acute bronchitis, unspecified: Secondary | ICD-10-CM | POA: Diagnosis not present

## 2018-11-25 DIAGNOSIS — J069 Acute upper respiratory infection, unspecified: Secondary | ICD-10-CM | POA: Diagnosis not present

## 2018-11-25 DIAGNOSIS — F17200 Nicotine dependence, unspecified, uncomplicated: Secondary | ICD-10-CM | POA: Diagnosis not present

## 2018-11-25 MED ORDER — PREDNISONE 20 MG PO TABS: ORAL_TABLET | ORAL | 0 refills | Status: DC

## 2018-11-25 MED ORDER — VARENICLINE TARTRATE 1 MG PO TABS: 1.0000 mg | ORAL_TABLET | Freq: Two times a day (BID) | ORAL | 1 refills | Status: DC

## 2018-11-25 MED ORDER — VARENICLINE TARTRATE 0.5 MG X 11 & 1 MG X 42 PO MISC: ORAL | 0 refills | Status: DC

## 2018-11-25 NOTE — Telephone Encounter (Signed)
PA approved on 11/25/18.   File ID: [id number]   Approved until 11/26/19.

## 2018-11-25 NOTE — Telephone Encounter (Signed)
PA sent via covermymed on 11/25/18.   Key: [id number]   Medication: Chantix starting Month Pak   Dx: Tobacco Dependence  - F17.200   Per Dr. Anitra Lauth pt has tried and failed: N/A    Waiting for response.

## 2018-11-25 NOTE — Patient Instructions (Signed)
Get otc generic robitussin DM OR Mucinex DM and use as directed on the packaging for cough and congestion. Use otc generic saline nasal spray 2-3 times per day to irrigate/moisturize your nasal passages.   

## 2018-11-25 NOTE — Progress Notes (Signed)
OFFICE VISIT  11/25/2018   CC:  Chief Complaint  Patient presents with  . Sinusitis    x1 month. Pt had flu and has been sick since with congestion and cough.Pt is Fasting.     HPI:    Patient is a 63 y.o.  male who presents for respiratory symptoms. Nasal congestion, PND, lots of coughing, all started about 4-6 wks ago.  No fevers.  No SOB, no wheezing.  No body aches, minimal fatigue at this point. Worse last week, better last couple days.  Exercising/deep breaths makes him cough a lot.  He started smoking again, would like to try chantix again. He is working out a lot more lately, purposeful wt loss.  Past Medical History:  Diagnosis Date  . Diverticulitis   . ED (erectile dysfunction)   . Elevated ferritin   . Hyperlipidemia   . Hypertension   . Nicotine dependence, cigarettes, in remission   . Obesity, Class I, BMI 30-34.9   . Prediabetes    A1c 5.9% 2019  . Vitamin D deficiency     Past Surgical History:  Procedure Laterality Date  . COLON SURGERY  2005   Partial resection (approx 1 foot per pt) for diverticulitis  . COLONOSCOPY  12/18/2008  . VASECTOMY      Outpatient Medications Prior to Visit  Medication Sig Dispense Refill  . aspirin 81 MG tablet Take 1 tablet by mouth daily.    . Cholecalciferol (VITAMIN D3) 5000 units CAPS Take 1 capsule by mouth daily.    Marland Kitchen erythromycin ophthalmic ointment Place 1 application into the left eye 3 (three) times daily. 3.5 g 0  . FIBER PO Take 3 capsules by mouth daily.    Marland Kitchen ibuprofen (ADVIL,MOTRIN) 200 MG tablet Take 1 tablet by mouth every 6 (six) hours as needed.    Marland Kitchen losartan (COZAAR) 50 MG tablet Take 1 tablet (50 mg total) by mouth daily. 90 tablet 0  . MISC NATURAL PRODUCTS PO Take 1 capsule by mouth daily. Healthy Brain    . Omega-3 Fatty Acids (OMEGA-3 FISH OIL) 1200 MG CAPS Take 1 capsule by mouth 2 (two) times daily.    . pravastatin (PRAVACHOL) 40 MG tablet Take 2 tablets (80 mg total) by mouth daily. 180  tablet 3  . sildenafil (REVATIO) 20 MG tablet 1-5 tabs po qd prn.  Take 30-60 min prior to intercourse 100 tablet 6  . levofloxacin (LEVAQUIN) 500 MG tablet Take 1 tablet (500 mg total) by mouth daily. 7 tablet 0  . meclizine (ANTIVERT) 25 MG tablet Take 1 tablet (25 mg total) by mouth 3 (three) times daily as needed for dizziness. 30 tablet 1   No facility-administered medications prior to visit.     Allergies  Allergen Reactions  . Ace Inhibitors     Cough  . Penicillins Rash    ROS As per HPI  PE: Blood pressure 135/81, pulse 74, temperature 98.1 F (36.7 C), temperature source Oral, resp. rate 16, height 5\' 7"  (1.702 m), weight 178 lb 6 oz (80.9 kg), SpO2 100 %. VS: noted--normal. Gen: alert, NAD, NONTOXIC APPEARING. HEENT: eyes without injection, drainage, or swelling.  Ears: EACs clear, TMs with normal light reflex and landmarks.  Nose: Clear rhinorrhea, with some dried, crusty exudate adherent to mildly injected mucosa.  No purulent d/c.  No paranasal sinus TTP.  No facial swelling.  Throat and mouth without focal lesion.  No pharyngial swelling, erythema, or exudate.   Neck: supple, no LAD.  LUNGS: CTA bilat, nonlabored resps.   CV: RRR, no m/r/g. EXT: no c/c/e SKIN: no rash   LABS:    Chemistry      Component Value Date/Time   NA 139 06/27/2018 0918   NA 141 05/29/2017   K 4.6 06/27/2018 0918   CL 102 06/27/2018 0918   CO2 31 06/27/2018 0918   BUN 13 06/27/2018 0918   BUN 11 05/29/2017   CREATININE 0.91 06/27/2018 0918   GLU 90 05/29/2017      Component Value Date/Time   CALCIUM 9.5 06/27/2018 0918   ALKPHOS 49 06/27/2018 0918   AST 23 06/27/2018 0918   ALT 31 06/27/2018 0918   BILITOT 0.7 06/27/2018 0918      IMPRESSION AND PLAN:  1) URI with cough, suspect prolonged viral illness with bronchitis. Prednisone 40mg  qd x 5d. Get otc generic robitussin DM OR Mucinex DM and use as directed on the packaging for cough and congestion. Use otc generic  saline nasal spray 2-3 times per day to irrigate/moisturize your nasal passages.    2) Tobacco abuse/dependence: he pretty recently quit and then restarted. Wants to quit again, was successful with chantix in the past so I rx'd this again today. Therapeutic expectations and side effect profile of medication discussed today.  Patient's questions answered.   An After Visit Summary was printed and given to the patient.  FOLLOW UP: Return if symptoms worsen or fail to improve.  Signed:  Crissie Sickles, MD           11/25/2018

## 2018-12-16 ENCOUNTER — Ambulatory Visit: Payer: PRIVATE HEALTH INSURANCE | Admitting: Family Medicine

## 2018-12-18 ENCOUNTER — Ambulatory Visit: Payer: PRIVATE HEALTH INSURANCE | Admitting: Family Medicine

## 2018-12-18 ENCOUNTER — Encounter: Payer: Self-pay | Admitting: Family Medicine

## 2018-12-18 VITALS — BP 117/70 | HR 70 | Temp 98.1°F | Resp 16 | Ht 67.0 in | Wt 176.1 lb

## 2018-12-18 DIAGNOSIS — E7800 Pure hypercholesterolemia, unspecified: Secondary | ICD-10-CM

## 2018-12-18 DIAGNOSIS — Z1211 Encounter for screening for malignant neoplasm of colon: Secondary | ICD-10-CM

## 2018-12-18 DIAGNOSIS — R7303 Prediabetes: Secondary | ICD-10-CM

## 2018-12-18 DIAGNOSIS — F17200 Nicotine dependence, unspecified, uncomplicated: Secondary | ICD-10-CM

## 2018-12-18 DIAGNOSIS — I10 Essential (primary) hypertension: Secondary | ICD-10-CM

## 2018-12-18 DIAGNOSIS — J42 Unspecified chronic bronchitis: Secondary | ICD-10-CM

## 2018-12-18 LAB — BASIC METABOLIC PANEL
BUN: 17 mg/dL (ref 6–23)
CO2: 28 mEq/L (ref 19–32)
Calcium: 8.7 mg/dL (ref 8.4–10.5)
Chloride: 105 mEq/L (ref 96–112)
Creatinine, Ser: 0.84 mg/dL (ref 0.40–1.50)
GFR: 92.11 mL/min (ref >60.00)
Glucose, Bld: 100 mg/dL — ABNORMAL HIGH (ref 70–99)
Potassium: 4.6 mEq/L (ref 3.5–5.1)
Sodium: 140 mEq/L (ref 135–145)

## 2018-12-18 LAB — HEMOGLOBIN A1C: Hgb A1c MFr Bld: 6.2 % (ref 4.6–6.5)

## 2018-12-18 NOTE — Progress Notes (Signed)
OFFICE VISIT  12/18/2018   CC:  Chief Complaint  Patient presents with  . Follow-up    RCI, pt is fasting.    HPI:    Patient is a 63 y.o.  male who presents for 6 mo f/u HTN, HLD, IFG.  He has started exercising/running daily.  Some weights. Compliant with medications, w/out adverse side effects.  Still smoking but cutting back, has a quit date in mind. Has chantix on hand to start; afraid of gaining all the wt back that he has lost.  ROS: on and off mucousy "smoker's" cough.  No hemoptysis, no abnl wt loss, no fevers. ROS: no CP, no SOB, no wheezing, no cough, no dizziness, no HAs, no rashes, no melena/hematochezia.  No polyuria or polydipsia.  No myalgias or arthralgias.   Past Medical History:  Diagnosis Date  . Diverticulitis   . ED (erectile dysfunction)   . Elevated ferritin   . Hyperlipidemia   . Hypertension   . Nicotine dependence, cigarettes, in remission   . Obesity, Class I, BMI 30-34.9   . Prediabetes    A1c 5.9% 2019  . Tobacco dependence   . Vitamin D deficiency     Past Surgical History:  Procedure Laterality Date  . COLON SURGERY  2005   Partial resection (approx 1 foot per pt) for diverticulitis  . COLONOSCOPY  12/18/2008  . VASECTOMY      Outpatient Medications Prior to Visit  Medication Sig Dispense Refill  . Biotin 10000 MCG TABS Take 1 capsule by mouth daily.    . Cholecalciferol (VITAMIN D3) 5000 units CAPS Take 1 capsule by mouth daily.    Marland Kitchen FIBER PO Take 3 capsules by mouth daily.    Marland Kitchen ibuprofen (ADVIL,MOTRIN) 200 MG tablet Take 1 tablet by mouth every 6 (six) hours as needed.    Marland Kitchen losartan (COZAAR) 50 MG tablet Take 1 tablet (50 mg total) by mouth daily. 90 tablet 0  . MISC NATURAL PRODUCTS PO Take 1 capsule by mouth daily. *Healthy Brain *[employer]    . Multiple Vitamin (MULTIVITAMIN) tablet Take 1 tablet by mouth daily.    Marland Kitchen omega-3 fish oil (MAXEPA) 1000 MG CAPS capsule Take 1 capsule by mouth 2 (two) times daily.     .  pravastatin (PRAVACHOL) 40 MG tablet Take 2 tablets (80 mg total) by mouth daily. 180 tablet 3  . sildenafil (REVATIO) 20 MG tablet 1-5 tabs po qd prn.  Take 30-60 min prior to intercourse 100 tablet 6  . varenicline (CHANTIX CONTINUING MONTH PAK) 1 MG tablet Take 1 tablet (1 mg total) by mouth 2 (two) times daily. (Patient not taking: Reported on 12/18/2018) 60 tablet 1  . varenicline (CHANTIX STARTING MONTH PAK) 0.5 MG X 11 & 1 MG X 42 tablet Take one 0.5 mg tablet by mouth once daily for 3 days, then increase to one 0.5 mg tablet twice daily for 4 days, then increase to one 1 mg tablet twice daily. (Patient not taking: Reported on 12/18/2018) 53 tablet 0  . aspirin 81 MG tablet Take 1 tablet by mouth daily.    Marland Kitchen erythromycin ophthalmic ointment Place 1 application into the left eye 3 (three) times daily. (Patient not taking: Reported on 12/18/2018) 3.5 g 0  . predniSONE (DELTASONE) 20 MG tablet 2 tabs po qd x 5d (Patient not taking: Reported on 12/18/2018) 10 tablet 0   No facility-administered medications prior to visit.     Allergies  Allergen Reactions  . Ace  Inhibitors     Cough  . Penicillins Rash    ROS As per HPI  PE: Blood pressure 117/70, pulse 70, temperature 98.1 F (36.7 C), temperature source Oral, resp. rate 16, height 5\' 7"  (1.702 m), weight 176 lb 2 oz (79.9 kg), SpO2 98 %. Gen: Alert, well appearing.  Patient is oriented to person, place, time, and situation. AFFECT: pleasant, lucid thought and speech. TFT:DDUK: no injection, icteris, swelling, or exudate.  EOMI, PERRLA. Mouth: lips without lesion/swelling.  Oral mucosa pink and moist. Oropharynx without erythema, exudate, or swelling.  CV: RRR, no m/r/g.   LUNGS: CTA bilat, nonlabored resps, good aeration in all lung fields. Some coarse insp rhonchi that clear immediately upon coughing. EXT: no clubbing or cyanosis.  no edema.    LABS:  Lab Results  Component Value Date   TSH 1.78 06/27/2018   Lab Results   Component Value Date   WBC 5.0 06/27/2018   HGB 14.6 06/27/2018   HCT 42.9 06/27/2018   MCV 91.8 06/27/2018   PLT 173.0 06/27/2018   Lab Results  Component Value Date   CREATININE 0.91 06/27/2018   BUN 13 06/27/2018   NA 139 06/27/2018   K 4.6 06/27/2018   CL 102 06/27/2018   CO2 31 06/27/2018   Lab Results  Component Value Date   ALT 31 06/27/2018   AST 23 06/27/2018   ALKPHOS 49 06/27/2018   BILITOT 0.7 06/27/2018   Lab Results  Component Value Date   CHOL 184 06/27/2018   Lab Results  Component Value Date   HDL 53.60 06/27/2018   Lab Results  Component Value Date   LDLCALC 102 (H) 06/27/2018   Lab Results  Component Value Date   TRIG 146.0 06/27/2018   Lab Results  Component Value Date   CHOLHDL 3 06/27/2018   Lab Results  Component Value Date   PSA 0.77 06/27/2018   Lab Results  Component Value Date   HGBA1C 5.9 06/27/2018    IMPRESSION AND PLAN:  1) HTN: The current medical regimen is effective;  continue present plan and medications. BMET today.  2) HLD: tolerating statin.  Lipids fine 6 mo ago, plan repeat FLP 6 mo. Continue TLC.  3) Prediabetes: he is making good exercise changes, good dietary changes. A1c today.  4) Chronic bronchitis: mild. All stable--needs to quit smoking!  Check CXR today.  5) Colon ca screening: refer to [facility]-->pt's initial screening colonoscopy was in 2010 in [place], [place].  An After Visit Summary was printed and given to the patient.  FOLLOW UP: Return in about 6 months (around 06/18/2019) for routine chronic illness f/u.  Signed:  Crissie Sickles, MD           12/18/2018

## 2018-12-19 ENCOUNTER — Encounter: Payer: Self-pay | Admitting: Family Medicine

## 2018-12-20 ENCOUNTER — Encounter: Payer: Self-pay | Admitting: Family Medicine

## 2019-01-19 ENCOUNTER — Other Ambulatory Visit: Payer: Self-pay | Admitting: Family Medicine

## 2019-01-20 MED ORDER — LOSARTAN POTASSIUM 50 MG PO TABS: 50.0000 mg | ORAL_TABLET | Freq: Every day | ORAL | 1 refills | Status: DC

## 2019-01-20 NOTE — Telephone Encounter (Signed)
SW pt and advised him that Rx for losartan will be sent in [place] in [place]. Pt voiced understanding.  Rx sent.

## 2019-01-20 NOTE — Telephone Encounter (Signed)
Need to call pt in regards to losartan, pt needs to select another pharmacy to get losartan, [facility] does not have it in stock.

## 2019-04-22 ENCOUNTER — Other Ambulatory Visit: Payer: Self-pay

## 2019-04-23 ENCOUNTER — Other Ambulatory Visit: Payer: Self-pay

## 2019-04-23 ENCOUNTER — Ambulatory Visit: Payer: PRIVATE HEALTH INSURANCE | Admitting: *Deleted

## 2019-04-23 VITALS — Ht 67.5 in | Wt 160.0 lb

## 2019-04-23 DIAGNOSIS — Z1211 Encounter for screening for malignant neoplasm of colon: Secondary | ICD-10-CM

## 2019-04-23 MED ORDER — SUPREP BOWEL PREP KIT 17.5-3.13-1.6 GM/177ML PO SOLN: 1.0000 | Freq: Once | ORAL | 0 refills | Status: AC

## 2019-04-23 NOTE — Progress Notes (Signed)
No egg or soy allergy known to patient  No issues with past sedation with any surgeries  or procedures, no intubation problems  No diet pills per patient No home 02 use per patient  No blood thinners per patient  Pt denies issues with constipation  No A fib or A flutter  EMMI video sent to pt's e mail   Pt verified name, DOB, address and insurance during PV today. Pt mailed instruction packet to included paper to complete and mail back to [facility] with addressed and stamped envelope, Emmi video, copy of consent form to read and not return, and instructions. Suprep $15  coupon mailed in packet. PV completed over the phone. Pt encouraged to call with questions or issues   Pt is aware that care partner will wait in the car during parking lot; if they feel like they will be too hot to wait in the car; they may wait in the lobby.  We want them to wear a mask (we do not have any that we can provide them), practice social distancing, and we will check their temperatures when they get here.  I did remind patient that their care partner needs to stay in the parking lot the entire time. Pt will wear mask into building

## 2019-05-05 ENCOUNTER — Telehealth: Payer: Self-pay | Admitting: Gastroenterology

## 2019-05-05 NOTE — Telephone Encounter (Signed)
Spoke with patient regarding Covid-19 screening questions °Covid-19 Screening Questions ° °Do you now or have you had a fever in the last 14 days?     No ° °Do you have any respiratory symptoms of shortness of breath or cough now or in the last 14 days?    No ° °Do you have any family members or close contacts with diagnosed or suspected Covid-19 in the past 14 days?     No ° °Have you been tested for Covid-19 and found to be positive?    No ° °Pt made aware of that care partner may wait in the car or come up to the lobby during the procedure but will need to provide their own mask. ° °  °

## 2019-05-06 ENCOUNTER — Other Ambulatory Visit: Payer: Self-pay

## 2019-05-06 ENCOUNTER — Ambulatory Visit (AMBULATORY_SURGERY_CENTER): Payer: PRIVATE HEALTH INSURANCE | Admitting: Gastroenterology

## 2019-05-06 ENCOUNTER — Encounter: Payer: Self-pay | Admitting: Gastroenterology

## 2019-05-06 VITALS — BP 131/71 | HR 63 | Temp 98.8°F | Resp 12 | Ht 67.5 in | Wt 160.0 lb

## 2019-05-06 DIAGNOSIS — D120 Benign neoplasm of cecum: Secondary | ICD-10-CM

## 2019-05-06 DIAGNOSIS — D125 Benign neoplasm of sigmoid colon: Secondary | ICD-10-CM

## 2019-05-06 DIAGNOSIS — Z860101 Personal history of adenomatous and serrated colon polyps: Secondary | ICD-10-CM

## 2019-05-06 DIAGNOSIS — D123 Benign neoplasm of transverse colon: Secondary | ICD-10-CM

## 2019-05-06 DIAGNOSIS — D122 Benign neoplasm of ascending colon: Secondary | ICD-10-CM | POA: Diagnosis not present

## 2019-05-06 DIAGNOSIS — Z1211 Encounter for screening for malignant neoplasm of colon: Secondary | ICD-10-CM | POA: Diagnosis not present

## 2019-05-06 DIAGNOSIS — Z86010 Personal history of colonic polyps: Secondary | ICD-10-CM

## 2019-05-06 HISTORY — DX: Personal history of colonic polyps: Z86.010

## 2019-05-06 HISTORY — DX: Personal history of adenomatous and serrated colon polyps: Z86.0101

## 2019-05-06 MED ORDER — SODIUM CHLORIDE 0.9 % IV SOLN: 500.0000 mL | Freq: Once | INTRAVENOUS | Status: DC

## 2019-05-06 NOTE — Op Note (Signed)
[place] Patient Name: Todd Barry Procedure Date: 05/06/2019 8:44 AM MRN: [id number] Endoscopist: Remo Lipps P. Havery Moros , MD Age: 63 Referring MD:  Date of Birth: [birth year] Gender: Male Account #: [id number] Procedure:                Colonoscopy Indications:              Screening for colorectal malignant neoplasm Medicines:                Monitored Anesthesia Care Procedure:                Pre-Anesthesia Assessment:                           - Prior to the procedure, a History and Physical                            was performed, and patient medications and                            allergies were reviewed. The patient's tolerance of                            previous anesthesia was also reviewed. The risks                            and benefits of the procedure and the sedation                            options and risks were discussed with the patient.                            All questions were answered, and informed consent                            was obtained. Prior Anticoagulants: The patient has                            taken no previous anticoagulant or antiplatelet                            agents. ASA Grade Assessment: II - A patient with                            mild systemic disease. After reviewing the risks                            and benefits, the patient was deemed in                            satisfactory condition to undergo the procedure.                           After obtaining informed consent, the colonoscope  was passed under direct vision. Throughout the                            procedure, the patient's blood pressure, pulse, and                            oxygen saturations were monitored continuously. The                            Colonoscope was introduced through the anus and                            advanced to the the cecum, identified by                            appendiceal orifice  and ileocecal valve. The                            colonoscopy was performed without difficulty. The                            patient tolerated the procedure well. The quality                            of the bowel preparation was good. The ileocecal                            valve, appendiceal orifice, and rectum were                            photographed. Scope In: 8:51:35 AM Scope Out: 9:10:51 AM Scope Withdrawal Time: 0 hours 14 minutes 4 seconds  Total Procedure Duration: 0 hours 19 minutes 16 seconds  Findings:                 The perianal and digital rectal examinations were                            normal.                           Two sessile polyps were found in the cecum. The                            polyps were 1 to 4 mm in size. These polyps were                            removed with a cold snare. Resection and retrieval                            were complete.                           A 7 mm polyp was found in the ascending colon. The  polyp was sessile. The polyp was removed with a                            cold snare. Resection and retrieval were complete.                           Three sessile polyps were found in the transverse                            colon. The polyps were 2 to 5 mm in size. These                            polyps were removed with a cold snare. Resection                            and retrieval were complete.                           A 4 mm polyp was found in the sigmoid colon. The                            polyp was sessile. The polyp was removed with a                            cold snare. Resection and retrieval were complete.                           A few small-mouthed diverticula were found in the                            sigmoid colon.                           There was evidence of a prior end-to-end                            colo-colonic anastomosis in the sigmoid colon. This                             was patent and was characterized by healthy                            appearing mucosa.                           Internal hemorrhoids were found during retroflexion.                           The exam was otherwise without abnormality. Complications:            No immediate complications. Estimated blood loss:                            Minimal. Impression:               -  Two 1 to 4 mm polyps in the cecum, removed with a                            cold snare. Resected and retrieved.                           - One 7 mm polyp in the ascending colon, removed                            with a cold snare. Resected and retrieved.                           - Three 2 to 5 mm polyps in the transverse colon,                            removed with a cold snare. Resected and retrieved.                           - One 4 mm polyp in the sigmoid colon, removed with                            a cold snare. Resected and retrieved.                           - Diverticulosis in the sigmoid colon.                           - Patent end-to-end colo-colonic anastomosis,                            characterized by healthy appearing mucosa.                           - Internal hemorrhoids.                           - The examination was otherwise normal. Recommendation:           - Patient has a contact number available for                            emergencies. The signs and symptoms of potential                            delayed complications were discussed with the                            patient. Return to normal activities tomorrow.                            Written discharge instructions were provided to the                            patient.                           -  Resume previous diet.                           - Continue present medications.                           - Await pathology results. Remo Lipps P. Jamaree Hosier, MD 05/06/2019 9:16:21 AM This report has been signed  electronically.

## 2019-05-06 NOTE — Patient Instructions (Signed)
Discharge instructions given. Handouts on polyps and Hemorrhoids. Resume previous medications. YOU HAD AN ENDOSCOPIC PROCEDURE TODAY AT THE [facility]:   Refer to the procedure report that was given to you for any specific questions about what was found during the examination.  If the procedure report does not answer your questions, please call your gastroenterologist to clarify.  If you requested that your care partner not be given the details of your procedure findings, then the procedure report has been included in a sealed envelope for you to review at your convenience later.  YOU SHOULD EXPECT: Some feelings of bloating in the abdomen. Passage of more gas than usual.  Walking can help get rid of the air that was put into your GI tract during the procedure and reduce the bloating. If you had a lower endoscopy (such as a colonoscopy or flexible sigmoidoscopy) you may notice spotting of blood in your stool or on the toilet paper. If you underwent a bowel prep for your procedure, you may not have a normal bowel movement for a few days.  Please Note:  You might notice some irritation and congestion in your nose or some drainage.  This is from the oxygen used during your procedure.  There is no need for concern and it should clear up in a day or so.  SYMPTOMS TO REPORT IMMEDIATELY:   Following lower endoscopy (colonoscopy or flexible sigmoidoscopy):  Excessive amounts of blood in the stool  Significant tenderness or worsening of abdominal pains  Swelling of the abdomen that is new, acute  Fever of 100F or higher   For urgent or emergent issues, a gastroenterologist can be reached at any hour by calling [phone].   DIET:  We do recommend a small meal at first, but then you may proceed to your regular diet.  Drink plenty of fluids but you should avoid alcoholic beverages for 24 hours.  ACTIVITY:  You should plan to take it easy for the rest of today and you should NOT DRIVE  or use heavy machinery until tomorrow (because of the sedation medicines used during the test).    FOLLOW UP: Our staff will call the number listed on your records 48-72 hours following your procedure to check on you and address any questions or concerns that you may have regarding the information given to you following your procedure. If we do not reach you, we will leave a message.  We will attempt to reach you two times.  During this call, we will ask if you have developed any symptoms of COVID 19. If you develop any symptoms (ie: fever, flu-like symptoms, shortness of breath, cough etc.) before then, please call [phone].  If you test positive for Covid 19 in the 2 weeks post procedure, please call and report this information to us.    If any biopsies were taken you will be contacted by phone or by letter within the next 1-3 weeks.  Please call us at [phone] if you have not heard about the biopsies in 3 weeks.    SIGNATURES/CONFIDENTIALITY: You and/or your care partner have signed paperwork which will be entered into your electronic medical record.  These signatures attest to the fact that that the information above on your After Visit Summary has been reviewed and is understood.  Full responsibility of the confidentiality of this discharge information lies with you and/or your care-partner. 

## 2019-05-06 NOTE — Progress Notes (Signed)
Pt's states no medical or surgical changes since previsit or office visit.  Temps CW Vitals JB

## 2019-05-06 NOTE — Progress Notes (Signed)
Called to room to assist during endoscopic procedure.  Patient ID and intended procedure confirmed with present staff. Received instructions for my participation in the procedure from the performing physician.  

## 2019-05-06 NOTE — Progress Notes (Signed)
A/ox3, pleased with MAC, report to RN 

## 2019-05-08 ENCOUNTER — Telehealth: Payer: Self-pay

## 2019-05-08 NOTE — Telephone Encounter (Signed)
  Follow up Call-  Call back number 05/06/2019  Post procedure Call Back phone  # [phone]  Permission to leave phone message Yes     Patient questions:  Do you have a fever, pain , or abdominal swelling? No. Pain Score  0 *  Have you tolerated food without any problems? Yes.    Have you been able to return to your normal activities? Yes.    Do you have any questions about your discharge instructions: Diet   No. Medications  No. Follow up visit  No.  Do you have questions or concerns about your Care? No.  Actions: * If pain score is 4 or above: No action needed, pain <4.  1. Have you developed a fever since your procedure? no  2.   Have you had an respiratory symptoms (SOB or cough) since your procedure? no  3.   Have you tested positive for COVID 19 since your procedure no  4.   Have you had any family members/close contacts diagnosed with the COVID 19 since your procedure?  no   If yes to any of these questions please route to Joylene John, RN and Alphonsa Gin, Therapist, sports.

## 2019-05-13 ENCOUNTER — Encounter: Payer: Self-pay | Admitting: Gastroenterology

## 2019-07-04 ENCOUNTER — Encounter: Payer: Self-pay | Admitting: Family Medicine

## 2019-07-18 ENCOUNTER — Encounter: Payer: PRIVATE HEALTH INSURANCE | Admitting: Family Medicine

## 2019-07-21 ENCOUNTER — Other Ambulatory Visit: Payer: Self-pay | Admitting: Family Medicine

## 2019-07-30 ENCOUNTER — Encounter: Payer: Self-pay | Admitting: Family Medicine

## 2019-07-30 ENCOUNTER — Other Ambulatory Visit: Payer: Self-pay

## 2019-07-30 ENCOUNTER — Ambulatory Visit (INDEPENDENT_AMBULATORY_CARE_PROVIDER_SITE_OTHER): Payer: PRIVATE HEALTH INSURANCE | Admitting: Family Medicine

## 2019-07-30 VITALS — BP 125/78 | HR 74 | Temp 98.3°F | Resp 16 | Ht 67.0 in | Wt 163.4 lb

## 2019-07-30 DIAGNOSIS — F17200 Nicotine dependence, unspecified, uncomplicated: Secondary | ICD-10-CM

## 2019-07-30 DIAGNOSIS — I10 Essential (primary) hypertension: Secondary | ICD-10-CM

## 2019-07-30 DIAGNOSIS — E7800 Pure hypercholesterolemia, unspecified: Secondary | ICD-10-CM

## 2019-07-30 DIAGNOSIS — R7303 Prediabetes: Secondary | ICD-10-CM

## 2019-07-30 DIAGNOSIS — Z23 Encounter for immunization: Secondary | ICD-10-CM | POA: Diagnosis not present

## 2019-07-30 NOTE — Progress Notes (Signed)
OFFICE VISIT  07/30/2019   CC:  Chief Complaint  Patient presents with  . Annual Exam    pt is fasting   HPI:    Patient is a 63 y.o. Caucasian male who presents for 6 mo f/u HTN, HLD, IFG/prediabetes. He has also been a smoker.  At the time of last f/u visit he was still smoking.  Chantix caused wt gain.  HTN: bp consistently <130/80 at home. Exercising excellently but also eating much improved, only 1 meal a day.  Swims and does treadmill. Has lost 12 lb purposefully.  HLD: taking 80 mg pravastatin daily.  Tobacco: he didn't try chantix again as planned.  Smokes 6-7 cigs per day now, trying to cut back still.  Past Medical History:  Diagnosis Date  . Allergy    mild from pollen   . Diverticulitis   . ED (erectile dysfunction)   . Elevated ferritin   . History of adenomatous polyp of colon 05/06/2019   Recall 5 yrs.  . Hyperlipidemia   . Hypertension   . Neuromuscular disorder (HCC)    hands cramping  . Nicotine dependence, cigarettes, in remission   . Obesity, Class I, BMI 30-34.9   . Prediabetes    A1c 5.9% 2019.  a1c 6.2% Feb 2020.  . Tobacco dependence   . Vitamin D deficiency     Past Surgical History:  Procedure Laterality Date  . COLON SURGERY  2005   Partial resection (approx 1 foot per pt) for diverticulitis  . COLONOSCOPY  12/18/2008;05/06/19   2020->adenoma->recall 5 yrs.  Marland Kitchen VASECTOMY     Social History   Socioeconomic History  . Marital status: Married    Spouse name: Not on file  . Number of children: Not on file  . Years of education: Not on file  . Highest education level: Not on file  Occupational History  . Not on file  Social Needs  . Financial resource strain: Not on file  . Food insecurity    Worry: Not on file    Inability: Not on file  . Transportation needs    Medical: Not on file    Non-medical: Not on file  Tobacco Use  . Smoking status: Former Smoker    Packs/day: 2.00    Years: 42.00    Pack years: 84.00    Types:  Cigarettes    Quit date: 05/15/2017    Years since quitting: 2.2  . Smokeless tobacco: Never Used  Substance and Sexual Activity  . Alcohol use: Yes    Comment: occiaionally  . Drug use: No  . Sexual activity: Not on file  Lifestyle  . Physical activity    Days per week: Not on file    Minutes per session: Not on file  . Stress: Not on file  Relationships  . Social Herbalist on phone: Not on file    Gets together: Not on file    Attends religious service: Not on file    Active member of club or organization: Not on file    Attends meetings of clubs or organizations: Not on file    Relationship status: Not on file  Other Topics Concern  . Not on file  Social History Narrative   Married, 4 children---two in their 87s, 2 younger.   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   Tob: former smoker, 50 pack-yr hx.  Quit summer 2018.   Alc: none  Exercise: likes to run 2-3 miles on treadmill regularly, and play tennis.     Outpatient Medications Prior to Visit  Medication Sig Dispense Refill  . Biotin 10000 MCG TABS Take 1 capsule by mouth daily.    . Cholecalciferol (VITAMIN D3) 5000 units CAPS Take 1 capsule by mouth daily.    Marland Kitchen FIBER PO Take 3 capsules by mouth daily.    Marland Kitchen ibuprofen (ADVIL,MOTRIN) 200 MG tablet Take 1 tablet by mouth every 6 (six) hours as needed.    Marland Kitchen losartan (COZAAR) 50 MG tablet Take 1 tablet (50 mg total) by mouth daily. 90 tablet 1  . Multiple Vitamin (MULTIVITAMIN) tablet Take 1 tablet by mouth daily.    Marland Kitchen omega-3 fish oil (MAXEPA) 1000 MG CAPS capsule Take 1 capsule by mouth 2 (two) times daily.     . pravastatin (PRAVACHOL) 40 MG tablet TAKE 2 TABLETS BY MOUTH EVERY DAY 60 tablet 0  . sildenafil (REVATIO) 20 MG tablet 1-5 tabs po qd prn.  Take 30-60 min prior to intercourse 100 tablet 6  . varenicline (CHANTIX CONTINUING MONTH PAK) 1 MG tablet Take 1 tablet (1 mg total) by mouth 2 (two) times daily. (Patient not  taking: Reported on 07/30/2019) 60 tablet 1  . varenicline (CHANTIX STARTING MONTH PAK) 0.5 MG X 11 & 1 MG X 42 tablet Take one 0.5 mg tablet by mouth once daily for 3 days, then increase to one 0.5 mg tablet twice daily for 4 days, then increase to one 1 mg tablet twice daily. (Patient not taking: Reported on 07/30/2019) 53 tablet 0  . MISC NATURAL PRODUCTS PO Take 1 capsule by mouth daily. *Healthy Brain *Raspberry Slim     No facility-administered medications prior to visit.     Allergies  Allergen Reactions  . Ace Inhibitors     Cough  . Penicillins Rash    ROS As per HPI  PE: Blood pressure 125/78, pulse 74, temperature 98.3 F (36.8 C), temperature source Temporal, resp. rate 16, height 5\' 7"  (1.702 m), weight 163 lb 6.4 oz (74.1 kg), SpO2 98 %. Gen: Alert, well appearing.  Patient is oriented to person, place, time, and situation. AFFECT: pleasant, lucid thought and speech. CV: RRR, no m/r/g.   LUNGS: CTA bilat, nonlabored resps, good aeration in all lung fields. EXT: no clubbing or cyanosis.   no edema.    LABS:  Lab Results  Component Value Date   TSH 1.78 06/27/2018   Lab Results  Component Value Date   WBC 5.0 06/27/2018   HGB 14.6 06/27/2018   HCT 42.9 06/27/2018   MCV 91.8 06/27/2018   PLT 173.0 06/27/2018   Lab Results  Component Value Date   CREATININE 0.84 12/18/2018   BUN 17 12/18/2018   NA 140 12/18/2018   K 4.6 12/18/2018   CL 105 12/18/2018   CO2 28 12/18/2018   Lab Results  Component Value Date   ALT 31 06/27/2018   AST 23 06/27/2018   ALKPHOS 49 06/27/2018   BILITOT 0.7 06/27/2018   Lab Results  Component Value Date   CHOL 184 06/27/2018   Lab Results  Component Value Date   HDL 53.60 06/27/2018   Lab Results  Component Value Date   LDLCALC 102 (H) 06/27/2018   Lab Results  Component Value Date   TRIG 146.0 06/27/2018   Lab Results  Component Value Date   CHOLHDL 3 06/27/2018   Lab Results  Component Value Date   PSA  0.77  06/27/2018   Lab Results  Component Value Date   HGBA1C 6.2 12/18/2018    IMPRESSION AND PLAN:  1) Prediabetes: doing GREAT with diet and exercise and wt loss. A1c and fasting glucose, lytes/cr today  2) HTN: The current medical regimen is effective;  continue present plan and medications. BMET today.  3) HLD: tolerating statin. FLP was great 06/2018.   FLP and AST/ALT today.  4) Preventative health care: Flu vaccine today.  An After Visit Summary was printed and given to the patient.  FOLLOW UP: No follow-ups on file.  CPE 6 mo  Signed:  Crissie Sickles, MD           07/30/2019

## 2019-07-31 LAB — COMPREHENSIVE METABOLIC PANEL
ALT: 24 U/L (ref 0–53)
AST: 21 U/L (ref 0–37)
Albumin: 4.7 g/dL (ref 3.5–5.2)
Alkaline Phosphatase: 50 U/L (ref 39–117)
BUN: 13 mg/dL (ref 6–23)
CO2: 28 mEq/L (ref 19–32)
Calcium: 10 mg/dL (ref 8.4–10.5)
Chloride: 103 mEq/L (ref 96–112)
Creatinine, Ser: 0.83 mg/dL (ref 0.40–1.50)
GFR: 93.21 mL/min (ref >60.00)
Glucose, Bld: 92 mg/dL (ref 70–99)
Potassium: 4.5 mEq/L (ref 3.5–5.1)
Sodium: 140 mEq/L (ref 135–145)
Total Bilirubin: 0.8 mg/dL (ref 0.2–1.2)
Total Protein: 7.1 g/dL (ref 6.0–8.3)

## 2019-07-31 LAB — LIPID PANEL
Cholesterol: 157 mg/dL (ref 0–200)
HDL: 52.9 mg/dL (ref >39.00)
LDL Cholesterol: 85 mg/dL (ref 0–99)
NonHDL: 104.15
Total CHOL/HDL Ratio: 3
Triglycerides: 94 mg/dL (ref 0.0–149.0)
VLDL: 18.8 mg/dL (ref 0.0–40.0)

## 2019-07-31 LAB — HEMOGLOBIN A1C: Hgb A1c MFr Bld: 5.8 % (ref 4.6–6.5)

## 2019-08-12 ENCOUNTER — Ambulatory Visit: Payer: PRIVATE HEALTH INSURANCE | Admitting: Family Medicine

## 2019-08-12 DIAGNOSIS — Z0289 Encounter for other administrative examinations: Secondary | ICD-10-CM

## 2019-08-12 NOTE — Progress Notes (Deleted)
OFFICE VISIT  08/12/2019   CC: No chief complaint on file.  HPI:    Patient is a 63 y.o. Caucasian male who presents for "fatty cyst on left hip".   Past Medical History:  Diagnosis Date  . Allergy    mild from pollen   . Diverticulitis   . ED (erectile dysfunction)   . Elevated ferritin   . History of adenomatous polyp of colon 05/06/2019   Recall 5 yrs.  . Hyperlipidemia   . Hypertension   . Neuromuscular disorder (HCC)    hands cramping  . Nicotine dependence, cigarettes, in remission   . Obesity, Class I, BMI 30-34.9   . Prediabetes    A1c 5.9% 2019.  a1c 6.2% Feb 2020.  . Tobacco dependence   . Vitamin D deficiency     Past Surgical History:  Procedure Laterality Date  . COLON SURGERY  2005   Partial resection (approx 1 foot per pt) for diverticulitis  . COLONOSCOPY  12/18/2008;05/06/19   2020->adenoma->recall 3 yrs per pt.  Marland Kitchen VASECTOMY      Outpatient Medications Prior to Visit  Medication Sig Dispense Refill  . Biotin 10000 MCG TABS Take 1 capsule by mouth daily.    . Cholecalciferol (VITAMIN D3) 5000 units CAPS Take 1 capsule by mouth daily.    Marland Kitchen FIBER PO Take 3 capsules by mouth daily.    Marland Kitchen ibuprofen (ADVIL,MOTRIN) 200 MG tablet Take 1 tablet by mouth every 6 (six) hours as needed.    Marland Kitchen losartan (COZAAR) 50 MG tablet Take 1 tablet (50 mg total) by mouth daily. 90 tablet 1  . Multiple Vitamin (MULTIVITAMIN) tablet Take 1 tablet by mouth daily.    Marland Kitchen omega-3 fish oil (MAXEPA) 1000 MG CAPS capsule Take 1 capsule by mouth 2 (two) times daily.     . pravastatin (PRAVACHOL) 40 MG tablet TAKE 2 TABLETS BY MOUTH EVERY DAY 60 tablet 0  . sildenafil (REVATIO) 20 MG tablet 1-5 tabs po qd prn.  Take 30-60 min prior to intercourse 100 tablet 6  . varenicline (CHANTIX CONTINUING MONTH PAK) 1 MG tablet Take 1 tablet (1 mg total) by mouth 2 (two) times daily. (Patient not taking: Reported on 07/30/2019) 60 tablet 1  . varenicline (CHANTIX STARTING MONTH PAK) 0.5 MG X 11 & 1  MG X 42 tablet Take one 0.5 mg tablet by mouth once daily for 3 days, then increase to one 0.5 mg tablet twice daily for 4 days, then increase to one 1 mg tablet twice daily. (Patient not taking: Reported on 07/30/2019) 53 tablet 0   No facility-administered medications prior to visit.     Allergies  Allergen Reactions  . Ace Inhibitors     Cough  . Penicillins Rash    ROS As per HPI  PE: There were no vitals taken for this visit. ***  LABS:    Chemistry      Component Value Date/Time   NA 140 07/30/2019 1436   NA 141 05/29/2017   K 4.5 07/30/2019 1436   CL 103 07/30/2019 1436   CO2 28 07/30/2019 1436   BUN 13 07/30/2019 1436   BUN 11 05/29/2017   CREATININE 0.83 07/30/2019 1436   GLU 90 05/29/2017      Component Value Date/Time   CALCIUM 10.0 07/30/2019 1436   ALKPHOS 50 07/30/2019 1436   AST 21 07/30/2019 1436   ALT 24 07/30/2019 1436   BILITOT 0.8 07/30/2019 1436     Lab Results  Component  Value Date   HGBA1C 5.8 07/30/2019    IMPRESSION AND PLAN:  No problem-specific Assessment & Plan notes found for this encounter.   An After Visit Summary was printed and given to the patient.  FOLLOW UP: No follow-ups on file.  Signed:  Crissie Sickles, MD           08/12/2019

## 2019-08-13 ENCOUNTER — Other Ambulatory Visit: Payer: Self-pay | Admitting: Family Medicine

## 2019-08-14 ENCOUNTER — Encounter: Payer: Self-pay | Admitting: Family Medicine

## 2019-08-14 ENCOUNTER — Other Ambulatory Visit: Payer: Self-pay

## 2019-08-14 ENCOUNTER — Ambulatory Visit: Payer: PRIVATE HEALTH INSURANCE | Admitting: Family Medicine

## 2019-08-14 VITALS — BP 129/83 | HR 70 | Temp 98.6°F | Resp 16 | Ht 67.0 in | Wt 166.2 lb

## 2019-08-14 DIAGNOSIS — D1779 Benign lipomatous neoplasm of other sites: Secondary | ICD-10-CM | POA: Diagnosis not present

## 2019-08-14 NOTE — Progress Notes (Signed)
OFFICE VISIT  08/14/2019   CC:  Chief Complaint  Patient presents with  . Cyst    on left hip    HPI:    Patient is a 63 y.o. Caucasian male who presents for "fatty cyst on left hip". Present for the last 15 yrs or so, was told by prior PCP that it was a fatty tumor and nothing to worry about. He used to be obese, lost a lot of weight over the last decade gradually. His wife saw him get out of the shower the other day and said "that thing's getting bigger so go get it checked out". No itching or pain at the area.  No redness.  Past Medical History:  Diagnosis Date  . Allergy    mild from pollen   . Diverticulitis   . ED (erectile dysfunction)   . Elevated ferritin   . History of adenomatous polyp of colon 05/06/2019   Recall 5 yrs.  . Hyperlipidemia   . Hypertension   . Neuromuscular disorder (HCC)    hands cramping  . Nicotine dependence, cigarettes, in remission   . Obesity, Class I, BMI 30-34.9   . Prediabetes    A1c 5.9% 2019.  a1c 6.2% Feb 2020.  . Tobacco dependence   . Vitamin D deficiency     Past Surgical History:  Procedure Laterality Date  . COLON SURGERY  2005   Partial resection (approx 1 foot per pt) for diverticulitis  . COLONOSCOPY  12/18/2008;05/06/19   2020->adenoma->recall 3 yrs per pt.  Marland Kitchen VASECTOMY     Social History   Socioeconomic History  . Marital status: Married    Spouse name: Not on file  . Number of children: Not on file  . Years of education: Not on file  . Highest education level: Not on file  Occupational History  . Not on file  Social Needs  . Financial resource strain: Not on file  . Food insecurity    Worry: Not on file    Inability: Not on file  . Transportation needs    Medical: Not on file    Non-medical: Not on file  Tobacco Use  . Smoking status: Former Smoker    Packs/day: 2.00    Years: 42.00    Pack years: 84.00    Types: Cigarettes    Quit date: 05/15/2017    Years since quitting: 2.2  . Smokeless  tobacco: Never Used  Substance and Sexual Activity  . Alcohol use: Yes    Comment: occiaionally  . Drug use: No  . Sexual activity: Not on file  Lifestyle  . Physical activity    Days per week: Not on file    Minutes per session: Not on file  . Stress: Not on file  Relationships  . Social Herbalist on phone: Not on file    Gets together: Not on file    Attends religious service: Not on file    Active member of club or organization: Not on file    Attends meetings of clubs or organizations: Not on file    Relationship status: Not on file  Other Topics Concern  . Not on file  Social History Narrative   Married, 4 children---two in their 34s, 2 younger.   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   Tob: former smoker, 50 pack-yr hx.  Quit summer 2018.   Alc: none   Exercise: likes to run 2-3  miles on treadmill regularly, and play tennis.    Outpatient Medications Prior to Visit  Medication Sig Dispense Refill  . Biotin 10000 MCG TABS Take 1 capsule by mouth daily.    . Cholecalciferol (VITAMIN D3) 5000 units CAPS Take 1 capsule by mouth daily.    Marland Kitchen FIBER PO Take 3 capsules by mouth daily.    Marland Kitchen ibuprofen (ADVIL,MOTRIN) 200 MG tablet Take 1 tablet by mouth every 6 (six) hours as needed.    Marland Kitchen losartan (COZAAR) 50 MG tablet Take 1 tablet (50 mg total) by mouth daily. 90 tablet 1  . Multiple Vitamin (MULTIVITAMIN) tablet Take 1 tablet by mouth daily.    Marland Kitchen omega-3 fish oil (MAXEPA) 1000 MG CAPS capsule Take 1 capsule by mouth 2 (two) times daily.     . pravastatin (PRAVACHOL) 40 MG tablet TAKE 2 TABLETS BY MOUTH EVERY DAY 60 tablet 0  . sildenafil (REVATIO) 20 MG tablet 1-5 tabs po qd prn.  Take 30-60 min prior to intercourse 100 tablet 6  . varenicline (CHANTIX CONTINUING MONTH PAK) 1 MG tablet Take 1 tablet (1 mg total) by mouth 2 (two) times daily. (Patient not taking: Reported on 08/14/2019) 60 tablet 1  . varenicline (CHANTIX STARTING  MONTH PAK) 0.5 MG X 11 & 1 MG X 42 tablet Take one 0.5 mg tablet by mouth once daily for 3 days, then increase to one 0.5 mg tablet twice daily for 4 days, then increase to one 1 mg tablet twice daily. (Patient not taking: Reported on 08/14/2019) 53 tablet 0   No facility-administered medications prior to visit.     Allergies  Allergen Reactions  . Ace Inhibitors     Cough  . Penicillins Rash    ROS As per HPI  PE: Blood pressure 129/83, pulse 70, temperature 98.6 F (37 C), temperature source Temporal, resp. rate 16, height 5\' 7"  (1.702 m), weight 166 lb 3.2 oz (75.4 kg), SpO2 100 %. Gen: Alert, well appearing.  Patient is oriented to person, place, time, and situation. AFFECT: pleasant, lucid thought and speech. Left hip lateral aspect with approx 3" x 4" oblong shaped mass that is soft but no fluid-like fluctuance is present.  No induration.  Nontender.  No erythema or streaking.  No warmth.  LABS:    Chemistry      Component Value Date/Time   NA 140 07/30/2019 1436   NA 141 05/29/2017   K 4.5 07/30/2019 1436   CL 103 07/30/2019 1436   CO2 28 07/30/2019 1436   BUN 13 07/30/2019 1436   BUN 11 05/29/2017   CREATININE 0.83 07/30/2019 1436   GLU 90 05/29/2017      Component Value Date/Time   CALCIUM 10.0 07/30/2019 1436   ALKPHOS 50 07/30/2019 1436   AST 21 07/30/2019 1436   ALT 24 07/30/2019 1436   BILITOT 0.8 07/30/2019 1436     Lab Results  Component Value Date   HGBA1C 5.8 07/30/2019     IMPRESSION AND PLAN:  Lipoma of L hip. Likely looks bigger b/c it is now more prominently seen due to his purposeful weight loss over the years. Reassured him, recommended monitoring for any worrisome changes but no treatment or referral at this time. He expressed understanding and agreement.  An After Visit Summary was printed and given to the patient.  FOLLOW UP: Return for as needed.  Signed:  Crissie Sickles, MD           08/14/2019

## 2019-09-02 ENCOUNTER — Other Ambulatory Visit: Payer: Self-pay

## 2019-09-02 MED ORDER — LOSARTAN POTASSIUM 50 MG PO TABS: 50.0000 mg | ORAL_TABLET | Freq: Every day | ORAL | 1 refills | Status: DC

## 2019-09-05 ENCOUNTER — Other Ambulatory Visit: Payer: Self-pay | Admitting: Family Medicine

## 2019-12-05 ENCOUNTER — Other Ambulatory Visit: Payer: Self-pay | Admitting: Family Medicine

## 2020-01-02 ENCOUNTER — Other Ambulatory Visit: Payer: Self-pay | Admitting: Family Medicine

## 2020-01-26 ENCOUNTER — Other Ambulatory Visit: Payer: Self-pay | Admitting: Family Medicine

## 2020-03-02 ENCOUNTER — Other Ambulatory Visit: Payer: Self-pay

## 2020-03-02 MED ORDER — LOSARTAN POTASSIUM 50 MG PO TABS: 50.0000 mg | ORAL_TABLET | Freq: Every day | ORAL | 0 refills | Status: DC

## 2020-03-04 ENCOUNTER — Ambulatory Visit (INDEPENDENT_AMBULATORY_CARE_PROVIDER_SITE_OTHER): Payer: 59 | Admitting: Family Medicine

## 2020-03-04 ENCOUNTER — Other Ambulatory Visit: Payer: Self-pay

## 2020-03-04 ENCOUNTER — Encounter: Payer: Self-pay | Admitting: Family Medicine

## 2020-03-04 VITALS — BP 129/79 | HR 77 | Temp 98.1°F | Resp 16 | Ht 67.0 in | Wt 170.8 lb

## 2020-03-04 DIAGNOSIS — F4323 Adjustment disorder with mixed anxiety and depressed mood: Secondary | ICD-10-CM | POA: Diagnosis not present

## 2020-03-04 DIAGNOSIS — F411 Generalized anxiety disorder: Secondary | ICD-10-CM

## 2020-03-04 DIAGNOSIS — G3184 Mild cognitive impairment, so stated: Secondary | ICD-10-CM

## 2020-03-04 DIAGNOSIS — B356 Tinea cruris: Secondary | ICD-10-CM

## 2020-03-04 MED ORDER — ESCITALOPRAM OXALATE 10 MG PO TABS: 10.0000 mg | ORAL_TABLET | Freq: Every day | ORAL | 1 refills | Status: DC

## 2020-03-04 NOTE — Progress Notes (Signed)
OFFICE VISIT  03/04/2020   CC:  Chief Complaint  Patient presents with  . Memory loss    short-term memory is not very good, work related stress  . Would like his lungs checked    history of smoking   HPI:    Patient is a 64 y.o. Caucasian male with HTN, HLD, prediabetes, and tobacco dependence who presents for "memory loss and check lungs".  Short term memory issues, gradual onset, feeling overwhelmed.   Lots of work-related stress.  He is burnt out, angry, frustrated. No depressed mood.  Getting sleep ok. Has always been pretty unorganized.  Easily distracted lately but not historically.   Long term memory is intact.   No HAs.  No vision or hearing complaints.  No feeling of instability.  No dizziness or presyncope. No signif dietary changes. OTC: vit D, biotin. NO rx med changes prior to onset of his sx's.   Not exercising, too busy.  Also, mildly itchy, irritated area of pinkish discoloration in L side of groin.  Nothing has been applied. Onset approx 2 mo ago. Has stopped wearing any underwear at all.  ROS: no fevers, no CP, no SOB, no wheezing, no cough, no dizziness, no HAs, no rashes, no melena/hematochezia.  No polyuria or polydipsia.  No myalgias or arthralgias.  No focal weakness, paresthesias, or tremors.  No acute vision or hearing abnormalities. No n/v/d or abd pain.  No palpitations.    ROS: no fevers, no CP, no SOB, no wheezing, no cough, no dizziness, no HAs, no rashes, no melena/hematochezia.  No polyuria or polydipsia.  No myalgias or arthralgias.  No focal weakness, paresthesias, or tremors.  No acute vision or hearing abnormalities. No n/v/d or abd pain.  No palpitations.    Past Medical History:  Diagnosis Date  . Allergy    mild from pollen   . Diverticulitis   . ED (erectile dysfunction)   . Elevated ferritin   . History of adenomatous polyp of colon 05/06/2019   Recall 5 yrs.  . Hyperlipidemia   . Hypertension   . Neuromuscular disorder (HCC)     hands cramping  . Nicotine dependence, cigarettes, in remission   . Obesity, Class I, BMI 30-34.9   . Prediabetes    A1c 5.9% 2019.  a1c 6.2% Feb 2020.  . Tobacco dependence   . Vitamin D deficiency     Past Surgical History:  Procedure Laterality Date  . COLON SURGERY  2005   Partial resection (approx 1 foot per pt) for diverticulitis  . COLONOSCOPY  12/18/2008;05/06/19   2020->adenoma->recall 3 yrs per pt.  Marland Kitchen VASECTOMY      Outpatient Medications Prior to Visit  Medication Sig Dispense Refill  . Biotin 10000 MCG TABS Take 1 capsule by mouth daily.    . Cholecalciferol (VITAMIN D3) 5000 units CAPS Take 1 capsule by mouth daily.    Marland Kitchen FIBER PO Take 3 capsules by mouth daily.    Marland Kitchen losartan (COZAAR) 50 MG tablet Take 1 tablet (50 mg total) by mouth daily. 90 tablet 0  . Multiple Vitamin (MULTIVITAMIN) tablet Take 1 tablet by mouth daily.    . pravastatin (PRAVACHOL) 40 MG tablet TAKE 2 TABLETS BY MOUTH EVERY DAY 60 tablet 3  . ibuprofen (ADVIL,MOTRIN) 200 MG tablet Take 1 tablet by mouth every 6 (six) hours as needed.    Marland Kitchen omega-3 fish oil (MAXEPA) 1000 MG CAPS capsule Take 1 capsule by mouth 2 (two) times daily.     Marland Kitchen  sildenafil (REVATIO) 20 MG tablet 1-5 tabs po qd prn.  Take 30-60 min prior to intercourse (Patient not taking: Reported on 03/04/2020) 100 tablet 6  . varenicline (CHANTIX CONTINUING MONTH PAK) 1 MG tablet Take 1 tablet (1 mg total) by mouth 2 (two) times daily. (Patient not taking: Reported on 08/14/2019) 60 tablet 1  . varenicline (CHANTIX STARTING MONTH PAK) 0.5 MG X 11 & 1 MG X 42 tablet Take one 0.5 mg tablet by mouth once daily for 3 days, then increase to one 0.5 mg tablet twice daily for 4 days, then increase to one 1 mg tablet twice daily. (Patient not taking: Reported on 08/14/2019) 53 tablet 0   No facility-administered medications prior to visit.    Allergies  Allergen Reactions  . Ace Inhibitors     Cough  . Penicillins Rash    ROS As per  HPI  PE: Blood pressure 129/79, pulse 77, temperature 98.1 F (36.7 C), temperature source Temporal, resp. rate 16, height 5\' 7"  (1.702 m), weight 170 lb 12.8 oz (77.5 kg), SpO2 99 %. Gen: Alert, well appearing.  Patient is oriented to person, place, time, and situation. AFFECT: pleasant, lucid thought and speech. [id number]: no injection, icteris, swelling, or exudate.  EOMI, PERRLA. Mouth: lips without lesion/swelling.  Oral mucosa pink and moist. Oropharynx without erythema, exudate, or swelling.  Neck: no thyromegaly or mass, no bruits. CV: RRR, no m/r/g.   LUNGS: CTA bilat, nonlabored resps, good aeration in all lung fields. ABD: soft, NT/ND EXT: no clubbing or cyanosis.  no edema.  Neuro: CN 2-12 intact bilaterally, strength 5/5 in proximal and distal upper extremities and lower extremities bilaterally.  No sensory deficits.  No tremor.  No disdiadochokinesis.  No ataxia.  Upper extremity and lower extremity DTRs symmetric.  No pronator drift. SKIN: mild pinkish discoloration of skin in L groin, fine superficial desquamation, borders well demarcated.  No maceration, pustules, vesicles, or bruising.   LABS:    Chemistry      Component Value Date/Time   NA 140 07/30/2019 1436   NA 141 05/29/2017 0000   K 4.5 07/30/2019 1436   CL 103 07/30/2019 1436   CO2 28 07/30/2019 1436   BUN 13 07/30/2019 1436   BUN 11 05/29/2017 0000   CREATININE 0.83 07/30/2019 1436   GLU 90 05/29/2017 0000      Component Value Date/Time   CALCIUM 10.0 07/30/2019 1436   ALKPHOS 50 07/30/2019 1436   AST 21 07/30/2019 1436   ALT 24 07/30/2019 1436   BILITOT 0.8 07/30/2019 1436     Lab Results  Component Value Date   CHOL 157 07/30/2019   HDL 52.90 07/30/2019   LDLCALC 85 07/30/2019   TRIG 94.0 07/30/2019   CHOLHDL 3 07/30/2019   Lab Results  Component Value Date   TSH 1.78 06/27/2018   Lab Results  Component Value Date   HGBA1C 5.8 07/30/2019    IMPRESSION AND PLAN:  1) Adjustment d/o  with mixed anxiety and depressed mood. Superimposed on GAD. Short term memory loss, mild cognitive impairment (word finding, etc)--suspected to be due to his severe anxiety/work stress/burnout. Discussed all this in detail with patient.  No blood or imaging w/u indicated at this time. He is considering switching jobs, considering his options. Will start trial of lexapro 10mg  qd.  Therapeutic expectations and side effect profile of medication discussed today.  Patient's questions answered.  2) Tinea cruris: recommended lamisil cream otc bid. Expect weeks until resolution with this  tx. Keep area as dry and cool as possible and avoid excessive friction in the area.  An After Visit Summary was printed and given to the patient.  FOLLOW UP: Return in about 4 weeks (around 04/01/2020) for f/u anx/stress and memory probs.  Signed:  Crissie Sickles, MD           03/04/2020

## 2020-03-26 ENCOUNTER — Other Ambulatory Visit: Payer: Self-pay | Admitting: Family Medicine

## 2020-03-26 NOTE — Telephone Encounter (Signed)
RX is new for patient.  Patient should have enough to last until next appt and then he can discuss w/ Dr Anitra Lauth about a 90 day supply.

## 2020-03-30 ENCOUNTER — Other Ambulatory Visit: Payer: Self-pay | Admitting: Family Medicine

## 2020-04-01 ENCOUNTER — Ambulatory Visit (INDEPENDENT_AMBULATORY_CARE_PROVIDER_SITE_OTHER): Payer: 59 | Admitting: Family Medicine

## 2020-04-01 ENCOUNTER — Encounter: Payer: Self-pay | Admitting: Family Medicine

## 2020-04-01 ENCOUNTER — Other Ambulatory Visit: Payer: Self-pay

## 2020-04-01 VITALS — BP 148/80 | HR 78 | Temp 98.4°F | Resp 16 | Ht 67.0 in | Wt 170.6 lb

## 2020-04-01 DIAGNOSIS — I10 Essential (primary) hypertension: Secondary | ICD-10-CM

## 2020-04-01 DIAGNOSIS — E7800 Pure hypercholesterolemia, unspecified: Secondary | ICD-10-CM | POA: Diagnosis not present

## 2020-04-01 DIAGNOSIS — R7301 Impaired fasting glucose: Secondary | ICD-10-CM

## 2020-04-01 DIAGNOSIS — F4322 Adjustment disorder with anxiety: Secondary | ICD-10-CM

## 2020-04-01 DIAGNOSIS — F411 Generalized anxiety disorder: Secondary | ICD-10-CM

## 2020-04-01 LAB — LIPID PANEL
Cholesterol: 176 mg/dL (ref 0–200)
HDL: 59.5 mg/dL (ref >39.00)
LDL Cholesterol: 96 mg/dL (ref 0–99)
NonHDL: 116.23
Total CHOL/HDL Ratio: 3
Triglycerides: 102 mg/dL (ref 0.0–149.0)
VLDL: 20.4 mg/dL (ref 0.0–40.0)

## 2020-04-01 LAB — COMPREHENSIVE METABOLIC PANEL
ALT: 25 U/L (ref 0–53)
AST: 22 U/L (ref 0–37)
Albumin: 4.6 g/dL (ref 3.5–5.2)
Alkaline Phosphatase: 53 U/L (ref 39–117)
BUN: 16 mg/dL (ref 6–23)
CO2: 29 mEq/L (ref 19–32)
Calcium: 9.3 mg/dL (ref 8.4–10.5)
Chloride: 102 mEq/L (ref 96–112)
Creatinine, Ser: 0.85 mg/dL (ref 0.40–1.50)
GFR: 90.5 mL/min (ref >60.00)
Glucose, Bld: 103 mg/dL — ABNORMAL HIGH (ref 70–99)
Potassium: 4.3 mEq/L (ref 3.5–5.1)
Sodium: 137 mEq/L (ref 135–145)
Total Bilirubin: 0.7 mg/dL (ref 0.2–1.2)
Total Protein: 6.9 g/dL (ref 6.0–8.3)

## 2020-04-01 LAB — HEMOGLOBIN A1C: Hgb A1c MFr Bld: 5.9 % (ref 4.6–6.5)

## 2020-04-01 MED ORDER — ESCITALOPRAM OXALATE 10 MG PO TABS: 10.0000 mg | ORAL_TABLET | Freq: Every day | ORAL | 1 refills | Status: DC

## 2020-04-01 MED ORDER — LOSARTAN POTASSIUM 50 MG PO TABS: ORAL_TABLET | ORAL | 3 refills | Status: DC

## 2020-04-01 NOTE — Progress Notes (Signed)
OFFICE VISIT  04/01/2020   CC:  Chief Complaint  Patient presents with  . Follow-up    anxiety/stress, memory problems  He is fasting today.  HPI:    Patient is a 64 y.o. Caucasian male with HTN, HLD, and prediabetes who presents for 1 mo f/u anxiety and it's subsequent effects on his short term memory and cognitive functioning.  A/P as of last visit: "1) Adjustment d/o with mixed anxiety and depressed mood. Superimposed on GAD. Short term memory loss, mild cognitive impairment (word finding, etc)--suspected to be due to his severe anxiety/work stress/burnout. Discussed all this in detail with patient.  No blood or imaging w/u indicated at this time. He is considering switching jobs, considering his options. Will start trial of lexapro 10mg  qd.  Therapeutic expectations and side effect profile of medication discussed today.  Patient's questions answered.  2) Tinea cruris: recommended lamisil cream otc bid. Expect weeks until resolution with this tx. Keep area as dry and cool as possible and avoid excessive friction in the area."  INTERIM HX: Feeling better. Feeling like lexapro is helping, feels like memory is better, is thinking clearer. Still having lots of work pressure, getting angry easily. 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  No home bp monitoring lately but he does have a bp cuff. He takes 2 of the 50mg  losartan, not 1 like our med list currently shows.  HLD: tolerating pravastatin.  IFG: trying to cut complex carbs out of diet, limit simple carbs and high fat foods. Exercise is minimal but he is active.  ROS: no fevers, no CP, no SOB, no wheezing, no cough, no dizziness, no HAs, no rashes, no melena/hematochezia.  No polyuria or polydipsia.  No myalgias or arthralgias.  No focal weakness, paresthesias, or tremors.  No acute vision or hearing abnormalities. No n/v/d or abd pain.  No  palpitations.     Past Medical History:  Diagnosis Date  . Allergy    mild from pollen   . Diverticulitis   . ED (erectile dysfunction)   . Elevated ferritin   . History of adenomatous polyp of colon 05/06/2019   Recall 5 yrs.  . Hyperlipidemia   . Hypertension   . Neuromuscular disorder (HCC)    hands cramping  . Nicotine dependence, cigarettes, in remission   . Obesity, Class I, BMI 30-34.9   . Prediabetes    A1c 5.9% 2019.  a1c 6.2% Feb 2020.  . Tobacco dependence   . Vitamin D deficiency     Past Surgical History:  Procedure Laterality Date  . COLON SURGERY  2005   Partial resection (approx 1 foot per pt) for diverticulitis  . COLONOSCOPY  12/18/2008;05/06/19   2020->adenoma->recall 3 yrs per pt.  Marland Kitchen VASECTOMY      Outpatient Medications Prior to Visit  Medication Sig Dispense Refill  . Biotin 10000 MCG TABS Take 1 capsule by mouth daily.    . Cholecalciferol (VITAMIN D3) 5000 units CAPS Take 1 capsule by mouth daily.    Marland Kitchen FIBER PO Take 3 capsules by mouth daily.    Marland Kitchen ibuprofen (ADVIL,MOTRIN) 200 MG tablet Take 1 tablet by mouth every 6 (six) hours as needed.    . Multiple Vitamin (MULTIVITAMIN) tablet Take 1 tablet by mouth daily.    Marland Kitchen omega-3 fish oil (MAXEPA) 1000 MG CAPS capsule Take 1 capsule by mouth 2 (two) times daily.     Marland Kitchen  pravastatin (PRAVACHOL) 40 MG tablet TAKE 2 TABLETS BY MOUTH EVERY DAY 180 tablet 0  . escitalopram (LEXAPRO) 10 MG tablet Take 1 tablet (10 mg total) by mouth daily. 30 tablet 1  . losartan (COZAAR) 50 MG tablet Take 1 tablet (50 mg total) by mouth daily. 90 tablet 0  . sildenafil (REVATIO) 20 MG tablet 1-5 tabs po qd prn.  Take 30-60 min prior to intercourse (Patient not taking: Reported on 03/04/2020) 100 tablet 6   No facility-administered medications prior to visit.    Allergies  Allergen Reactions  . Ace Inhibitors     Cough  . Penicillins Rash    ROS As per HPI  PE: Vitals with BMI 04/01/2020 03/04/2020 08/14/2019  Height  5\' 7"  5\' 7"  5\' 7"   Weight 170 lbs 10 oz 170 lbs 13 oz 166 lbs 3 oz  BMI 26.71 [id number]  Systolic 123456 [id number] Q000111Q  Diastolic 80 79 83  Pulse 78 77 70  Initial bp today 148/80.  Repeat manually was 140/80.  Gen: Alert, well appearing.  Patient is oriented to person, place, time, and situation. AFFECT: pleasant, lucid thought and speech.  He is notably calmer in demeanor compared to his usual at past visits. No further exam today.  LABS:   Lab Results  Component Value Date   [id number] 09/02/2016    Lab Results  Component Value Date   TSH 1.78 06/27/2018   Lab Results  Component Value Date   WBC 5.0 06/27/2018   HGB 14.6 06/27/2018   HCT 42.9 06/27/2018   MCV 91.8 06/27/2018   PLT 173.0 06/27/2018   Lab Results  Component Value Date   CREATININE 0.83 07/30/2019   BUN 13 07/30/2019   NA 140 07/30/2019   K 4.5 07/30/2019   CL 103 07/30/2019   CO2 28 07/30/2019   Lab Results  Component Value Date   ALT 24 07/30/2019   AST 21 07/30/2019   ALKPHOS 50 07/30/2019   BILITOT 0.8 07/30/2019   Lab Results  Component Value Date   CHOL 157 07/30/2019   Lab Results  Component Value Date   HDL 52.90 07/30/2019   Lab Results  Component Value Date   LDLCALC 85 07/30/2019   Lab Results  Component Value Date   TRIG 94.0 07/30/2019   Lab Results  Component Value Date   CHOLHDL 3 07/30/2019   Lab Results  Component Value Date   PSA 0.77 06/27/2018   Lab Results  Component Value Date   HGBA1C 5.8 07/30/2019    IMPRESSION AND PLAN:  1) Adjustment d/o with anxiety, superimposed on GAD. He is signif improved on lexapro 10mg  for 1 mo. Continue this for minimum of 9-12 mo.  2) HTN: bp mildly elevated here today but has been well controlled in the past on current med regimen. Plan is to check bp at home daily and bring numbers in for review with me in 1 mo. Lytes/cr today.  3) Hyperchol: tolerating statin.  Working on diet/exercise. FLP and hepatic panel  today.  4) IFG: working on diet/exercise. Hba1c and fasting gluc today.  An After Visit Summary was printed and given to the patient.  FOLLOW UP: Return in about 4 weeks (around 04/29/2020) for f/u HTN.  Signed:  Crissie Sickles, MD           04/01/2020

## 2020-05-03 ENCOUNTER — Encounter: Payer: Self-pay | Admitting: Family Medicine

## 2020-05-03 ENCOUNTER — Other Ambulatory Visit: Payer: Self-pay

## 2020-05-03 ENCOUNTER — Ambulatory Visit (INDEPENDENT_AMBULATORY_CARE_PROVIDER_SITE_OTHER): Payer: 59 | Admitting: Family Medicine

## 2020-05-03 VITALS — BP 129/74 | HR 71 | Temp 98.1°F | Resp 16 | Ht 67.0 in | Wt 168.6 lb

## 2020-05-03 DIAGNOSIS — I10 Essential (primary) hypertension: Secondary | ICD-10-CM | POA: Diagnosis not present

## 2020-05-03 DIAGNOSIS — F411 Generalized anxiety disorder: Secondary | ICD-10-CM

## 2020-05-03 DIAGNOSIS — F4322 Adjustment disorder with anxiety: Secondary | ICD-10-CM

## 2020-05-03 DIAGNOSIS — E7800 Pure hypercholesterolemia, unspecified: Secondary | ICD-10-CM

## 2020-05-03 DIAGNOSIS — K4090 Unilateral inguinal hernia, without obstruction or gangrene, not specified as recurrent: Secondary | ICD-10-CM

## 2020-05-03 MED ORDER — PRAVASTATIN SODIUM 40 MG PO TABS: ORAL_TABLET | ORAL | 3 refills | Status: DC

## 2020-05-03 NOTE — Patient Instructions (Signed)
° °Inguinal Hernia, Adult °An inguinal hernia is when fat or your intestines push through a weak spot in a muscle where your leg meets your lower belly (groin). This causes a rounded lump (bulge). This kind of hernia could also be: °· In your scrotum, if you are male. °· In folds of skin around your vagina, if you are male. °There are three types of inguinal hernias. These include: °· Hernias that can be pushed back into the belly (are reducible). This type rarely causes pain. °· Hernias that cannot be pushed back into the belly (are incarcerated). °· Hernias that cannot be pushed back into the belly and lose their blood supply (are strangulated). This type needs emergency surgery. °If you do not have symptoms, you may not need treatment. If you have symptoms or a large hernia, you may need surgery. °Follow these instructions at home: °Lifestyle °· Do these things if told by your doctor so you do not have trouble pooping (constipation): °? Drink enough fluid to keep your pee (urine) pale yellow. °? Eat foods that have a lot of fiber. These include fresh fruits and vegetables, whole grains, and beans. °? Limit foods that are high in fat and processed sugars. These include foods that are fried or sweet. °? Take medicine for trouble pooping. °· Avoid lifting heavy objects. °· Avoid standing for long amounts of time. °· Do not use any products that contain nicotine or tobacco. These include cigarettes and e-cigarettes. If you need help quitting, ask your doctor. °· Stay at a healthy weight. °General instructions °· You may try to push your hernia in by very gently pressing on it when you are lying down. Do not try to force the bulge back in if it will not push in easily. °· Watch your hernia for any changes in shape, size, or color. Tell your doctor if you see any changes. °· Take over-the-counter and prescription medicines only as told by your doctor. °· Keep all follow-up visits as told by your doctor. This is  important. °Contact a doctor if: °· You have a fever. °· You have new symptoms. °· Your symptoms get worse. °Get help right away if: °· The area where your leg meets your lower belly has: °? Pain that gets worse suddenly. °? A bulge that gets bigger suddenly, and it does not get smaller after that. °? A bulge that turns red or purple. °? A bulge that is painful when you touch it. °· You are a man, and your scrotum: °? Suddenly feels painful. °? Suddenly changes in size. °· You cannot push the hernia in by very gently pressing on it when you are lying down. Do not try to force the bulge back in if it will not push in easily. °· You feel sick to your stomach (nauseous), and that feeling does not go away. °· You throw up (vomit), and that keeps happening. °· You have a fast heartbeat. °· You cannot poop (have a bowel movement) or pass gas. °These symptoms may be an emergency. Do not wait to see if the symptoms will go away. Get medical help right away. Call your local emergency services (911 in the [place] °Summary °· An inguinal hernia is when fat or your intestines push through a weak spot in a muscle where your leg meets your lower belly (groin). This causes a rounded lump (bulge). °· If you do not have symptoms, you may not need treatment. If you have symptoms or a large hernia,   you may need surgery. °· Avoid lifting heavy objects. Also avoid standing for long amounts of time. °· Do not try to force the bulge back in if it will not push in easily. °This information is not intended to replace advice given to you by your health care provider. Make sure you discuss any questions you have with your health care provider. °Document Revised: 11/24/2017 Document Reviewed: 07/25/2017 °Elsevier Patient Education © 2020 [employer] ° °

## 2020-05-03 NOTE — Progress Notes (Signed)
OFFICE VISIT  05/03/2020   CC:  Chief Complaint  Patient presents with  . Follow-up    hypertension, pt is fasting     HPI:    Patient is a 64 y.o. Caucasian male who presents for 1 mo f/u HTN. A/P as of last visit: "1) Adjustment d/o with anxiety, superimposed on GAD. He is signif improved on lexapro 74m for 1 mo. Continue this for minimum of 9-12 mo.  2) HTN: bp mildly elevated here today but has been well controlled in the past on current med regimen. Plan is to check bp at home daily and bring numbers in for review with me in 1 mo. Lytes/cr today.  3) Hyperchol: tolerating statin.  Working on diet/exercise. FLP and hepatic panel today.  4) IFG: working on diet/exercise. Hba1c and fasting gluc today."  INTERIM HX: Lipids,A1c, and met panel all good last visit.  He feels better. Retired since last visit. Still feels like the lexapro helped him significantly. Has not felt like he has had any elev bp so he didn't check bp since last visit here. Compliant with bp med, two of the 577mlosartan qd.  HLD: we corrected his dosing in EMR today: he tells me he has always been taking ONE 4041mravastatin not two qd.  Moved some stuff into a condo a couple days ago, heavy lifting, noted a bulge come out in L groin.  This has come out before on and off over the years. No pain.  Goes back in easily.  No n/v.  No change in bowel habits.   Past Medical History:  Diagnosis Date  . Allergy    mild from pollen   . Diverticulitis   . ED (erectile dysfunction)   . Elevated ferritin   . History of adenomatous polyp of colon 05/06/2019   Recall 5 yrs.  . Hyperlipidemia   . Hypertension   . Neuromuscular disorder (HCC)    hands cramping  . Nicotine dependence, cigarettes, in remission   . Obesity, Class I, BMI 30-34.9   . Prediabetes    A1c 5.9% 2019.  a1c 6.2% Feb 2020.  . Tobacco dependence   . Vitamin D deficiency     Past Surgical History:  Procedure Laterality  Date  . COLON SURGERY  2005   Partial resection (approx 1 foot per pt) for diverticulitis  . COLONOSCOPY  12/18/2008;05/06/19   2020->adenoma->recall 3 yrs per pt.  . VMarland KitchenSECTOMY      Outpatient Medications Prior to Visit  Medication Sig Dispense Refill  . Biotin 10000 MCG TABS Take 1 capsule by mouth daily.    . Cholecalciferol (VITAMIN D3) 5000 units CAPS Take 1 capsule by mouth daily.    . eMarland Kitchencitalopram (LEXAPRO) 10 MG tablet Take 1 tablet (10 mg total) by mouth daily. 90 tablet 1  . FIBER PO Take 3 capsules by mouth daily.    . iMarland Kitchenuprofen (ADVIL,MOTRIN) 200 MG tablet Take 1 tablet by mouth every 6 (six) hours as needed.    . lMarland Kitchensartan (COZAAR) 50 MG tablet 2 tabs po qd 180 tablet 3  . Multiple Vitamin (MULTIVITAMIN) tablet Take 1 tablet by mouth daily.    . oMarland [place]-3 fish oil (MAXEPA) 1000 MG CAPS capsule Take 1 capsule by mouth 2 (two) times daily.     . pravastatin (PRAVACHOL) 40 MG tablet TAKE 2 TABLETS BY MOUTH EVERY DAY 180 tablet 0  . sildenafil (REVATIO) 20 MG tablet 1-5 tabs po qd prn.  Take 30-60 min prior to  intercourse (Patient not taking: Reported on 03/04/2020) 100 tablet 6   No facility-administered medications prior to visit.    Allergies  Allergen Reactions  . Ace Inhibitors     Cough  . Penicillins Rash    ROS As per HPI  PE: Vitals with BMI 05/03/2020 04/01/2020 03/04/2020  Height _0  _1  _2   Weight 168 lbs 10 oz 170 lbs 10 oz 170 lbs 13 oz  BMI 26.4 53.97 67.34  Systolic 193 790 240  Diastolic 74 80 79  Pulse 71 78 77  O2 sat on RA today is 98%  Gen: Alert, well appearing.  Patient is oriented to person, place, time, and situation. AFFECT: pleasant, lucid thought and speech. L inguinal ligament level with soft bulge, reduces w/out problem. Non-tender, non-erythematous.  No mass in scrotum.  LABS:  Lab Results  Component Value Date   TSH 1.78 06/27/2018   Lab Results  Component Value Date   WBC 5.0 06/27/2018   HGB 14.6 06/27/2018   HCT 42.9  06/27/2018   MCV 91.8 06/27/2018   PLT 173.0 06/27/2018   Lab Results  Component Value Date   CREATININE 0.85 04/01/2020   BUN 16 04/01/2020   NA 137 04/01/2020   K 4.3 04/01/2020   CL 102 04/01/2020   CO2 29 04/01/2020   Lab Results  Component Value Date   ALT 25 04/01/2020   AST 22 04/01/2020   ALKPHOS 53 04/01/2020   BILITOT 0.7 04/01/2020   Lab Results  Component Value Date   CHOL 176 04/01/2020   Lab Results  Component Value Date   HDL 59.50 04/01/2020   Lab Results  Component Value Date   LDLCALC 96 04/01/2020   Lab Results  Component Value Date   TRIG 102.0 04/01/2020   Lab Results  Component Value Date   CHOLHDL 3 04/01/2020   Lab Results  Component Value Date   PSA 0.77 06/27/2018   Lab Results  Component Value Date   HGBA1C 5.9 04/01/2020    IMPRESSION AND PLAN:  1) L inguinal hernia: reducible.  Asymptomatic at this time. Discussed general info about the diagnosis. Signs/symptoms to call or return for were reviewed and pt expressed understanding. Reviewed basic educ handout and gave this to pt to take home and review today.  2) HTN: The current medical regimen is effective;  continue present plan and medications. Recent lytes/cr normal.  Continue losartan 18m, TWO tabs qd.  3) HLD: The current medical regimen is effective; taking ONE 453mpravastatin qd. Lipids recently good: LDL 96, hepatic panel normal.  4) GAD with adustment d/o with anxious mood. MUCH improved.  Plan on staying on this med at least 9 more months. 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  An After Visit Summary was printed and given to the patient.  FOLLOW UP: Return in about 6 months (around 11/02/2020) for annual CPE (fasting).  Signed:  PhCrissie SicklesMD           05/03/2020

## 2020-05-30 ENCOUNTER — Other Ambulatory Visit: Payer: Self-pay | Admitting: Family Medicine

## 2020-06-02 ENCOUNTER — Telehealth: Payer: Self-pay

## 2020-06-02 NOTE — Telephone Encounter (Signed)
Called patient to offer appt but advised PCP did not have anything for today. He wanted to speak with PCP and asked twice when he would be available. Advised we do not do treatment over the phone and would need an appt to further discuss. He declined at this time but would call back if worsened.

## 2020-06-02 NOTE — Telephone Encounter (Addendum)
Client [facility] - Client Client Site [place] - Day Physician Crissie Sickles - MD Contact Type Call Who Is Calling Patient / Member / Family / Caregiver Caller Name Ubly Phone Number [phone] Patient Name Todd Barry Patient DOB August 26, 2056 Call Type Message Only Information Provided Reason for Call Request to Schedule Office Appointment Initial Comment Her husband has a rash, needs to make a virtual appt. They are currently out of town. Pls call back Disp. Time Disposition Final User 06/02/2020 12:39:03 PM General Information Provided Yes Donato Heinz Call Closed By: Donato Heinz Transaction Date/Time: 06/02/2020 12:36:27 PM (ET)

## 2020-06-26 ENCOUNTER — Other Ambulatory Visit: Payer: Self-pay | Admitting: Family Medicine

## 2020-06-30 ENCOUNTER — Telehealth: Payer: Self-pay

## 2020-06-30 ENCOUNTER — Other Ambulatory Visit: Payer: Self-pay | Admitting: Family Medicine

## 2020-06-30 NOTE — Telephone Encounter (Signed)
Patient request Rx for losartan (COZAAR) 50 MG tablet.

## 2020-06-30 NOTE — Telephone Encounter (Signed)
Patient called [facility]. Rx is not there. Please contact the pharmacy to see why they haven't filled the Rx.

## 2020-06-30 NOTE — Telephone Encounter (Signed)
Please patient' wife Glenard Haring [phone]

## 2020-06-30 NOTE — Telephone Encounter (Signed)
Patient advised 1 year supply given on 04/01/20 #180 with 3 refills. RF [facility] available at [facility]. Patient will contact pharmacy for further assistance.

## 2020-07-08 ENCOUNTER — Ambulatory Visit (INDEPENDENT_AMBULATORY_CARE_PROVIDER_SITE_OTHER): Payer: Medicare Other | Admitting: Family Medicine

## 2020-07-08 ENCOUNTER — Other Ambulatory Visit: Payer: Self-pay

## 2020-07-08 ENCOUNTER — Encounter: Payer: Self-pay | Admitting: Family Medicine

## 2020-07-08 VITALS — BP 119/68 | HR 68 | Temp 98.2°F | Resp 16 | Ht 67.0 in | Wt 173.2 lb

## 2020-07-08 DIAGNOSIS — K4090 Unilateral inguinal hernia, without obstruction or gangrene, not specified as recurrent: Secondary | ICD-10-CM

## 2020-07-08 DIAGNOSIS — Z23 Encounter for immunization: Secondary | ICD-10-CM | POA: Diagnosis not present

## 2020-07-08 NOTE — Progress Notes (Signed)
OFFICE NOTE  07/08/2020  CC:  Chief Complaint  Patient presents with  . Hernia    would like referral, no improvement    HPI:   Patient is a 64 y.o. Caucasian male who is here for "wanting referral for hernia". I dx'd him with L inguinal hernia 04/2020. Says it pops out all the time, hurts when it pops out, harder and harder for him to reduce it.  Wearing a groin binder and it helps but he says "I don't want to wear this the rest of my life". Very active physically. He requests referral to gen surg today.  Pertinent PMH:  Anx/dep HTN HLD ED Left inguinal hernia  PSH: Partial colectomy for divertic  MEDS;   Outpatient Medications Prior to Visit  Medication Sig Dispense Refill  . Biotin 10000 MCG TABS Take 1 capsule by mouth daily.    . Cholecalciferol (VITAMIN D3) 5000 units CAPS Take 1 capsule by mouth daily.    Marland Kitchen escitalopram (LEXAPRO) 10 MG tablet Take 1 tablet (10 mg total) by mouth daily. 90 tablet 1  . FIBER PO Take 3 capsules by mouth daily.    Marland Kitchen ibuprofen (ADVIL,MOTRIN) 200 MG tablet Take 1 tablet by mouth every 6 (six) hours as needed.    Marland Kitchen losartan (COZAAR) 50 MG tablet TAKE 1 TABLET BY MOUTH EVERY DAY 90 tablet 0  . Multiple Vitamin (MULTIVITAMIN) tablet Take 1 tablet by mouth daily.    Marland Kitchen omega-3 fish oil (MAXEPA) 1000 MG CAPS capsule Take 1 capsule by mouth 2 (two) times daily.     . pravastatin (PRAVACHOL) 40 MG tablet 1 tab po qd 90 tablet 3  . sildenafil (REVATIO) 20 MG tablet 1-5 tabs po qd prn.  Take 30-60 min prior to intercourse 100 tablet 6   No facility-administered medications prior to visit.    PE: Vitals with BMI 07/08/2020 05/03/2020 04/01/2020  Height 5\' 7"  5\' 7"  5\' 7"   Weight 173 lbs 3 oz 168 lbs 10 oz 170 lbs 10 oz  BMI 27.12 09.7 35.32  Systolic 992 426 834  Diastolic 68 74 80  Pulse 68 71 78    Gen: Alert, well appearing.  Patient is oriented to person, place, time, and situation. AFFECT: pleasant, lucid thought and speech. Abd: soft and  NT/ND Groin: I could not visualize or palpate a hernia today.  No tenderness. Scrotum and tests w/out mass or tenderness. No groin adenopathy.  IMPRESSION AND PLAN:  Left inguinal hernia: lower abd binder helpful but hurts more when it protrudes and it is doing so more lately.  He desires referral to gen surgery for consideration of elective repair. Referral ordered today.  Wear abd binder in the meantime. Signs/symptoms to call or return for were reviewed and pt expressed understanding.  Flu vaccine given today.  An After Visit Summary was printed and given to the patient.  FOLLOW UP:  Return for Keep appt set for Jan 2022.  Signed:  Crissie Sickles, MD           07/08/2020

## 2020-08-03 ENCOUNTER — Encounter: Payer: Self-pay | Admitting: Surgery

## 2020-08-03 ENCOUNTER — Ambulatory Visit: Payer: Self-pay | Admitting: Surgery

## 2020-08-03 DIAGNOSIS — Z8719 Personal history of other diseases of the digestive system: Secondary | ICD-10-CM | POA: Diagnosis not present

## 2020-08-03 DIAGNOSIS — Z720 Tobacco use: Secondary | ICD-10-CM | POA: Diagnosis not present

## 2020-08-03 DIAGNOSIS — K4020 Bilateral inguinal hernia, without obstruction or gangrene, not specified as recurrent: Secondary | ICD-10-CM | POA: Insufficient documentation

## 2020-08-03 NOTE — H&P (Signed)
Todd Barry Appointment: 08/03/2020 8:45 AM Location: [facility] Patient #: [id number] DOB: 18-May-1956 Married / Language: English / Race: White Male  History of Present Illness Todd Hector MD; 08/03/2020 9:11 AM) The patient is a 64 year old male who presents with an inguinal hernia. Note for "Inguinal hernia": ` ` ` Patient sent for surgical consultation at the request of Julien Nordmann, MD  Chief Complaint: Left groin pain and swelling. Probable hernia. ` ` The patient is a pleasant active male who noted some intermittent left groin swelling. Discussed with primary care physician. Inguinal hernia suspected. Patient site aware what sounds like a truss versus a binder. He notes left high groin bulging that will go down. Usually can reduce it. Recent symptoms. Worsening discomfort. Surgical consultation offered. He comes today by himself. He does smoke. He does note easy urinating more often during the day but no pain or discomfort or foul odor. No history urinary tract infections. He did have some intermittent discomfort after his vasectomy many years ago. His bowels twice a day. Had a colectomy in his late 88s when he lived on [place] for what sounds like diverticulitis. Follow-up colonoscopy by [facility] gastroenterology about 3 years ago rather underwhelming. He is moderately active. Uses a treadmill. Claims he can walk several miles without difficulty. No history of heart attack or stroke. He is not on blood thinners. Not diabetic. No sleep apnea.   (Review of systems as stated in this history (HPI) or in the review of systems. Otherwise all other 12 point ROS are negative) ` ` ###########################################`  This patient encounter took 30 minutes today to perform the following: obtain history, perform exam, review outside records, interpret tests & imaging, counsel the patient on their diagnosis; and, document this encounter,  including findings & plan in the electronic health record (EHR).   Past Surgical History Illene Regulus, CMA; 08/03/2020 8:33 AM) Colon Polyp Removal - Colonoscopy Colon Removal - Partial  Allergies (Alisha Spillers, CMA; 08/03/2020 8:34 AM) Penicillins  Medication History (Alisha Spillers, CMA; 08/03/2020 8:36 AM) Escitalopram Oxalate (10MG  Tablet, Oral) Active. Losartan Potassium (50MG  Tablet, Oral) Active. Pravastatin Sodium (40MG  Tablet, Oral) Active. Biotin (10000MCG Tablet, Oral) Active. Multiple Vitamin (Oral) Active. Vitamin D (Cholecalciferol) (10 MCG(400 UNIT) Tablet Chewable, Oral) Active. Omega 3 (1000MG  Capsule, Oral) Active. Medications Reconciled  Social History Illene Regulus, CMA; 08/03/2020 8:33 AM) Alcohol use Moderate alcohol use. Caffeine use Carbonated beverages, Coffee. No drug use Tobacco use Current every day smoker.  Family History Illene Regulus, CMA; 08/03/2020 8:33 AM) Diabetes Mellitus Daughter, Mother. Heart disease in male family member before age 58 Hypertension Mother.  Other Problems Lars Mage Spillers, CMA; 08/03/2020 8:33 AM) Diverticulosis High blood pressure Inguinal Hernia     Review of Systems (Alisha Spillers CMA; 08/03/2020 8:33 AM) General Not Present- Appetite Loss, Chills, Fatigue, Fever, Night Sweats, Weight Gain and Weight Loss. Skin Not Present- Change in Wart/Mole, Dryness, Hives, Jaundice, New Lesions, Non-Healing Wounds, Rash and Ulcer. HEENT Present- Seasonal Allergies. Not Present- Earache, Hearing Loss, Hoarseness, Nose Bleed, Oral Ulcers, Ringing in the Ears, Sinus Pain, Sore Throat, Visual Disturbances, Wears glasses/contact lenses and Yellow Eyes. Respiratory Not Present- Bloody sputum, Chronic Cough, Difficulty Breathing, Snoring and Wheezing. Breast Not Present- Breast Mass, Breast Pain, Nipple Discharge and Skin Changes. Cardiovascular Not Present- Chest Pain, Difficulty Breathing Lying Down,  Leg Cramps, Palpitations, Rapid Heart Rate, Shortness of Breath and Swelling of Extremities. Gastrointestinal Not Present- Abdominal Pain, Bloating, Bloody Stool, Change in Bowel Habits, Chronic diarrhea,  Constipation, Difficulty Swallowing, Excessive gas, Gets full quickly at meals, Hemorrhoids, Indigestion, Nausea, Rectal Pain and Vomiting. Male Genitourinary Present- Frequency. Not Present- Blood in Urine, Change in Urinary Stream, Impotence, Nocturia, Painful Urination, Urgency and Urine Leakage. Musculoskeletal Not Present- Back Pain, Joint Pain, Joint Stiffness, Muscle Pain, Muscle Weakness and Swelling of Extremities. Neurological Not Present- Decreased Memory, Fainting, Headaches, Numbness, Seizures, Tingling, Tremor, Trouble walking and Weakness. Psychiatric Not Present- Anxiety, Bipolar, Change in Sleep Pattern, Depression, Fearful and Frequent crying. Endocrine Not Present- Cold Intolerance, Excessive Hunger, Hair Changes, Heat Intolerance, Hot flashes and New Diabetes. Hematology Not Present- Blood Thinners, Easy Bruising, Excessive bleeding, Gland problems, HIV and Persistent Infections.  Vitals (Alisha Spillers CMA; 08/03/2020 8:34 AM) 08/03/2020 8:34 AM Weight: 178 lb Height: 67in Body Surface Area: 1.92 m Body Mass Index: 27.88 kg/m  Pulse: 88 (Regular)  BP: 124/78(Sitting, Left Arm, Standard)        Physical Exam Todd Hector MD; 08/03/2020 8:48 AM)  General Mental Status-Alert. General Appearance-Not in acute distress, Not Sickly. Orientation-Oriented X3. Hydration-Well hydrated. Voice-Normal.  Integumentary Global Assessment Upon inspection and palpation of skin surfaces of the - Axillae: non-tender, no inflammation or ulceration, no drainage. and Distribution of scalp and body hair is normal. General Characteristics Temperature - normal warmth is noted.  Head and Neck Head-normocephalic, atraumatic with no lesions or palpable  masses. Face Global Assessment - atraumatic, no absence of expression. Neck Global Assessment - no abnormal movements, no bruit auscultated on the right, no bruit auscultated on the left, no decreased range of motion, non-tender. Trachea-midline. Thyroid Gland Characteristics - non-tender.  Eye Eyeball - Left-Extraocular movements intact, No Nystagmus - Left. Eyeball - Right-Extraocular movements intact, No Nystagmus - Right. Cornea - Left-No Hazy - Left. Cornea - Right-No Hazy - Right. Sclera/Conjunctiva - Left-No scleral icterus, No Discharge - Left. Sclera/Conjunctiva - Right-No scleral icterus, No Discharge - Right. Pupil - Left-Direct reaction to light normal. Pupil - Right-Direct reaction to light normal.  ENMT Ears Pinna - Left - no drainage observed, no generalized tenderness observed. Pinna - Right - no drainage observed, no generalized tenderness observed. Nose and Sinuses External Inspection of the Nose - no destructive lesion observed. Inspection of the nares - Left - quiet respiration. Inspection of the nares - Right - quiet respiration. Mouth and Throat Lips - Upper Lip - no fissures observed, no pallor noted. Lower Lip - no fissures observed, no pallor noted. Nasopharynx - no discharge present. Oral Cavity/Oropharynx - Tongue - no dryness observed. Oral Mucosa - no cyanosis observed. Hypopharynx - no evidence of airway distress observed.  Chest and Lung Exam Inspection Movements - Normal and Symmetrical. Accessory muscles - No use of accessory muscles in breathing. Palpation Palpation of the chest reveals - Non-tender. Auscultation Breath sounds - Normal and Clear.  Cardiovascular Auscultation Rhythm - Regular. Murmurs & Other Heart Sounds - Auscultation of the heart reveals - No Murmurs and No Systolic Clicks.  Abdomen Inspection Inspection of the abdomen reveals - No Visible peristalsis and No Abnormal pulsations. Umbilicus - No Bleeding, No  Urine drainage. Palpation/Percussion Palpation and Percussion of the abdomen reveal - Soft, Non Tender, No Rebound tenderness, No Rigidity (guarding) and No Cutaneous hyperesthesia. Note: Abdomen soft. There is to be thin-walled but no definite diastases or umbilical hernia. Low midline incision intact without any obvious hernia. Nontender. Not distended. No umbilical or incisional hernias. No guarding.  Male Genitourinary Sexual Maturity Tanner 5 - Adult hair pattern and Adult penile size and  shape. Note: Circumcised normal external male genitalia.  Sensitivity at left groin with cough concerning for hernia. 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 No obvious testicular or epididymal or spermatic masses  Peripheral Vascular Upper Extremity Inspection - Left - No Cyanotic nailbeds - Left, Not Ischemic. Inspection - Right - No Cyanotic nailbeds - Right, Not Ischemic.  Neurologic Neurologic evaluation reveals -normal attention span and ability to concentrate, able to name objects and repeat phrases. Appropriate fund of knowledge , normal sensation and normal coordination. Mental Status Affect - not angry, not paranoid. Cranial Nerves-Normal Bilaterally. Gait-Normal.  Neuropsychiatric Mental status exam performed with findings of-able to articulate well with normal speech/language, rate, volume and coherence, thought content normal with ability to perform basic computations and apply abstract reasoning and no evidence of hallucinations, delusions, obsessions or homicidal/suicidal ideation.  Musculoskeletal Global Assessment Spine, Ribs and Pelvis - no instability, subluxation or laxity. Right Upper Extremity - no instability, subluxation or laxity.  Lymphatic Head & Neck  General Head & Neck Lymphatics: Bilateral - Description - No Localized  lymphadenopathy. Axillary  General Axillary Region: Bilateral - Description - No Localized lymphadenopathy. Femoral & Inguinal  Generalized Femoral & Inguinal Lymphatics: Left - Description - No Localized lymphadenopathy. Right - Description - No Localized lymphadenopathy.    Assessment & Plan Todd Hector MD; 08/03/2020 9:05 AM)  BILATERAL INGUINAL HERNIA WITHOUT OBSTRUCTION OR GANGRENE, RECURRENCE NOT SPECIFIED (K40.20) Impression: Rather classic history and exam suspicious for left inguinal hernia. Definite right inguinal hernia as well. Increasing symptoms.  I think he would benefit from laparoscopic exploration and repair of hernias found. Should be outpatient surgery.  His admission proceeding. 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 Therefore surgery in November. Has not had severe symptoms at that is reasonable. I went over expected recovery and technique. Questions answered. He is interested in proceeding.   PREOP - ING HERNIA - ENCOUNTER FOR PREOPERATIVE EXAMINATION FOR GENERAL SURGICAL PROCEDURE (Z01.818)  Current Plans You are being scheduled for surgery- Our schedulers will call you.  You should hear from our office's scheduling department within 5 working days about the location, date, and time of surgery. We try to make accommodations for patient's preferences in scheduling surgery, but sometimes the OR schedule or the surgeon's schedule prevents [place] from making those accommodations.  If you have not heard from our office [phone]) in 5 working days, call the office and ask for your surgeon's nurse.  If you have other questions about your diagnosis, plan, or surgery, call the office and ask for your surgeon's nurse.  Written instructions provided The anatomy & physiology of the abdominal wall and pelvic floor was discussed. The pathophysiology of hernias in the inguinal and pelvic region was discussed. Natural history risks such as progressive enlargement,  pain, incarceration, and strangulation was discussed. Contributors to complications such as smoking, obesity, diabetes, prior surgery, etc were discussed.  I feel the risks of no intervention will lead to serious problems that outweigh the operative risks; therefore, I recommended surgery to reduce and repair the hernia. I explained laparoscopic techniques with possible need for an open approach. I noted usual use of mesh to patch and/or buttress hernia repair  Risks such as bleeding, infection, abscess, need for further treatment, heart attack, death, and other risks were discussed. I noted a good likelihood this will help address the problem. Goals of post-operative recovery were discussed as  well. Possibility that this will not correct all symptoms was explained. I stressed the importance of low-impact activity, aggressive pain control, avoiding constipation, & not pushing through pain to minimize risk of post-operative chronic pain or injury. Possibility of reherniation was discussed. We will work to minimize complications.  An educational handout further explaining the pathology & treatment options was given as well. Questions were answered. The patient expresses understanding & wishes to proceed with surgery.  Pt Education - Pamphlet Given - Laparoscopic Hernia Repair: discussed with patient and provided information. Pt Education - [facility] Pain Control (Kajsa Butrum) Pt Education - [facility] Hernia Post-Op HCI (Jream Broyles): discussed with patient and provided information. Pt Education - [facility] Mesh education: discussed with patient and provided information.  TOBACCO ABUSE (Z72.0) Impression: STOP SMOKING!  We talked to the patient about the dangers of smoking. We stressed that tobacco use dramatically increases the risk of peri-operative complications such as infection, tissue necrosis leaving to problems with incision/wound and organ healing, hernia, chronic pain, heart attack, stroke, DVT, pulmonary  embolism, and death. We noted there are programs in our community to help stop smoking. Information was available.  Current Plans Pt Education - [facility] STOP SMOKING!  HISTORY OF DIVERTICULITIS (Z87.19) Impression: History of colectomy for diverticulitis around the mid 1990s. Underwhelming colonoscopy since then.  Todd Hector, MD, FACS, MASCRS Gastrointestinal and Minimally Invasive Surgery  [facility] [address], [place], [place] [address] [phone] Fax [phone] Main/Paging  CONTACT INFORMATION: Weekday (9AM-5PM) concerns: Call [facility] main office at [phone] Weeknight (5PM-9AM) or Weekend/Holiday concerns: Check [email or web address] for General Surgery [facility] coverage (Please, do not use SecureChat as it is not reliable communication to operating surgeons for immediate patient care)

## 2020-09-15 ENCOUNTER — Other Ambulatory Visit: Payer: Self-pay | Admitting: Family Medicine

## 2020-10-19 DIAGNOSIS — Z01818 Encounter for other preprocedural examination: Secondary | ICD-10-CM | POA: Diagnosis not present

## 2020-11-04 ENCOUNTER — Other Ambulatory Visit: Payer: Self-pay | Admitting: Family Medicine

## 2020-11-06 DIAGNOSIS — Z122 Encounter for screening for malignant neoplasm of respiratory organs: Secondary | ICD-10-CM

## 2020-11-06 HISTORY — DX: Encounter for screening for malignant neoplasm of respiratory organs: Z12.2

## 2020-11-09 ENCOUNTER — Encounter: Payer: Self-pay | Admitting: Family Medicine

## 2020-11-09 ENCOUNTER — Other Ambulatory Visit: Payer: Self-pay

## 2020-11-09 ENCOUNTER — Ambulatory Visit (INDEPENDENT_AMBULATORY_CARE_PROVIDER_SITE_OTHER): Payer: Medicare Other | Admitting: Family Medicine

## 2020-11-09 VITALS — BP 149/78 | HR 64 | Temp 98.0°F | Resp 16 | Ht 66.5 in | Wt 175.6 lb

## 2020-11-09 DIAGNOSIS — I10 Essential (primary) hypertension: Secondary | ICD-10-CM | POA: Diagnosis not present

## 2020-11-09 DIAGNOSIS — Z122 Encounter for screening for malignant neoplasm of respiratory organs: Secondary | ICD-10-CM

## 2020-11-09 DIAGNOSIS — Z23 Encounter for immunization: Secondary | ICD-10-CM | POA: Diagnosis not present

## 2020-11-09 DIAGNOSIS — R7303 Prediabetes: Secondary | ICD-10-CM

## 2020-11-09 DIAGNOSIS — E7800 Pure hypercholesterolemia, unspecified: Secondary | ICD-10-CM

## 2020-11-09 DIAGNOSIS — Z125 Encounter for screening for malignant neoplasm of prostate: Secondary | ICD-10-CM

## 2020-11-09 DIAGNOSIS — Z720 Tobacco use: Secondary | ICD-10-CM

## 2020-11-09 DIAGNOSIS — Z0000 Encounter for general adult medical examination without abnormal findings: Secondary | ICD-10-CM

## 2020-11-09 LAB — CBC WITH DIFFERENTIAL/PLATELET
Basophils Absolute: 0 10*3/uL (ref 0.0–0.1)
Basophils Relative: 0.7 % (ref 0.0–3.0)
Eosinophils Absolute: 0.1 10*3/uL (ref 0.0–0.7)
Eosinophils Relative: 1.4 % (ref 0.0–5.0)
HCT: 43.1 % (ref 39.0–52.0)
Hemoglobin: 14.7 g/dL (ref 13.0–17.0)
Lymphocytes Relative: 33.3 % (ref 12.0–46.0)
Lymphs Abs: 1.8 10*3/uL (ref 0.7–4.0)
MCHC: 34.2 g/dL (ref 30.0–36.0)
MCV: 94.1 fl (ref 78.0–100.0)
Monocytes Absolute: 0.5 10*3/uL (ref 0.1–1.0)
Monocytes Relative: 8.8 % (ref 3.0–12.0)
Neutro Abs: 3 10*3/uL (ref 1.4–7.7)
Neutrophils Relative %: 55.8 % (ref 43.0–77.0)
Platelets: 158 10*3/uL (ref 150.0–400.0)
RBC: 4.58 Mil/uL (ref 4.22–5.81)
RDW: 14.1 % (ref 11.5–15.5)
WBC: 5.4 10*3/uL (ref 4.0–10.5)

## 2020-11-09 LAB — COMPREHENSIVE METABOLIC PANEL
ALT: 22 U/L (ref 0–53)
AST: 23 U/L (ref 0–37)
Albumin: 4.6 g/dL (ref 3.5–5.2)
Alkaline Phosphatase: 46 U/L (ref 39–117)
BUN: 18 mg/dL (ref 6–23)
CO2: 29 mEq/L (ref 19–32)
Calcium: 9 mg/dL (ref 8.4–10.5)
Chloride: 103 mEq/L (ref 96–112)
Creatinine, Ser: 0.86 mg/dL (ref 0.40–1.50)
GFR: 90.88 mL/min (ref >60.00)
Glucose, Bld: 101 mg/dL — ABNORMAL HIGH (ref 70–99)
Potassium: 4.2 mEq/L (ref 3.5–5.1)
Sodium: 139 mEq/L (ref 135–145)
Total Bilirubin: 0.8 mg/dL (ref 0.2–1.2)
Total Protein: 6.9 g/dL (ref 6.0–8.3)

## 2020-11-09 LAB — LIPID PANEL
Cholesterol: 199 mg/dL (ref 0–200)
HDL: 53 mg/dL (ref >39.00)
LDL Cholesterol: 122 mg/dL — ABNORMAL HIGH (ref 0–99)
NonHDL: 145.74
Total CHOL/HDL Ratio: 4
Triglycerides: 119 mg/dL (ref 0.0–149.0)
VLDL: 23.8 mg/dL (ref 0.0–40.0)

## 2020-11-09 LAB — TSH: TSH: 1.16 u[IU]/mL (ref 0.35–4.50)

## 2020-11-09 LAB — PSA, MEDICARE: PSA: 0.59 ng/ml (ref 0.10–4.00)

## 2020-11-09 LAB — HEMOGLOBIN A1C: Hgb A1c MFr Bld: 5.8 % (ref 4.6–6.5)

## 2020-11-09 MED ORDER — ZOSTER VAC RECOMB ADJUVANTED 50 MCG/0.5ML IM SUSR: 0.5000 mL | Freq: Once | INTRAMUSCULAR | 0 refills | Status: AC

## 2020-11-09 NOTE — Addendum Note (Signed)
Addended by: Emi Holes D on: 11/09/2020 09:51 AM   Modules accepted: Orders

## 2020-11-09 NOTE — Addendum Note (Signed)
Addended by: Jeoffrey Massed on: 11/09/2020 09:43 AM   Modules accepted: Orders

## 2020-11-09 NOTE — Patient Instructions (Signed)

## 2020-11-09 NOTE — Progress Notes (Addendum)
Office Note 11/09/2020  CC:  Chief Complaint  Patient presents with  . Annual Exam    HPI:  Todd Barry is a 65 y.o. White male who is here for annual health maintenance exam and for 6 mo f/u prediabetes, HTN, and HLD. A/P as of last visit: "1) L inguinal hernia: reducible.  Asymptomatic at this time. Discussed general info about the diagnosis. Signs/symptoms to call or return for were reviewed and pt expressed understanding. Reviewed basic educ handout and gave this to pt to take home and review today.  2) HTN: The current medical regimen is effective;  continue present plan and medications. Recent lytes/cr normal.  Continue losartan 50mg , TWO tabs qd.  3) HLD: The current medical regimen is effective; taking ONE 40mg  pravastatin qd. Lipids recently good: LDL 96, hepatic panel normal.  4) GAD with adustment d/o with anxious mood. MUCH improved.  Plan on staying on this med at least 9 more months. 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  Wearing belt sometimes.  Asymptomatic. Feeling well. Active with yard work.  HTN: no home bp monitoring.  Says he is compliant with bp med. Some lightheadedness in mornings upon getting out of bed last 2d, no nausea or vertigo. Lasts a few minutes.  No presyncope or falls.  No CP. He drinks colas and beer more than any actual clear fluids.   Past Medical History:  Diagnosis Date  . Allergy    mild from pollen   . Diverticulitis   . ED (erectile dysfunction)   . Elevated ferritin   . History of adenomatous polyp of colon 05/06/2019   Recall 5 yrs.  . Hyperlipidemia   . Hypertension   . Neuromuscular disorder (HCC)    hands cramping  . Nicotine dependence, cigarettes, in remission   . Obesity, Class I, BMI 30-34.9   . Prediabetes    A1c 5.9% 2019.  a1c 6.2% Feb 2020.  . Tobacco  dependence   . Vitamin D deficiency     Past Surgical History:  Procedure Laterality Date  . COLON SURGERY  2005   Partial resection (approx 1 foot per pt) for diverticulitis  . COLONOSCOPY  12/18/2008;05/06/19   2020->adenoma->recall 3 yrs per pt.  02/15/2009 VASECTOMY      Family History  Problem Relation Age of Onset  . Diabetes Mother   . Hypertension Mother   . Heart disease Father   . Heart attack Father   . Breast cancer Sister   . Hypertension Brother   . Diabetes Daughter   . Vision loss Daughter   . Colon cancer Neg Hx   . Colon polyps Neg Hx   . Esophageal cancer Neg Hx   . Rectal cancer Neg Hx   . Stomach cancer Neg Hx     Social History   Socioeconomic History  . Marital status: Married    Spouse name: Not on file  . Number of children: Not on file  . Years of education: Not on file  . Highest education level: Not on file  Occupational History  . Not on file  Tobacco Use  . Smoking status: Former Smoker    Packs/day: 2.00    Years: 42.00    Pack years: 84.00    Types: Cigarettes    Quit date: 05/15/2017    Years since quitting: 3.4  . Smokeless  tobacco: Never Used  Vaping Use  . Vaping Use: Never used  Substance and Sexual Activity  . Alcohol use: Yes    Comment: occiaionally  . Drug use: No  . Sexual activity: Not on file  Other Topics Concern  . Not on file  Social History Narrative   Married, 4 children---two in their 58s, 2 younger.   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   Tob: former smoker, 50 pack-yr hx.  Quit summer 2018.   Alc: none   Exercise: likes to run 2-3 miles on treadmill regularly, and play tennis.   Social Determinants of Health   Financial Resource Strain: Not on file  Food Insecurity: Not on file  Transportation Needs: Not on file  Physical Activity: Not on file  Stress: Not on file  Social Connections: Not on file  Intimate Partner Violence: Not on file    Outpatient Medications  Prior to Visit  Medication Sig Dispense Refill  . Biotin 10000 MCG TABS Take 1 capsule by mouth daily.    . Cholecalciferol (VITAMIN D3) 5000 units CAPS Take 1 capsule by mouth daily.    Marland Kitchen escitalopram (LEXAPRO) 10 MG tablet Take 1 tablet (10 mg total) by mouth daily. 90 tablet 1  . FIBER PO Take 3 capsules by mouth daily.    Marland Kitchen ibuprofen (ADVIL,MOTRIN) 200 MG tablet Take 1 tablet by mouth every 6 (six) hours as needed.    Marland Kitchen losartan (COZAAR) 50 MG tablet TAKE 1 TABLET BY MOUTH EVERY DAY 90 tablet 0  . Multiple Vitamin (MULTIVITAMIN) tablet Take 1 tablet by mouth daily.    Marland Kitchen omega-3 fish oil (MAXEPA) 1000 MG CAPS capsule Take 1 capsule by mouth 2 (two) times daily.     . pravastatin (PRAVACHOL) 40 MG tablet 1 tab po qd 90 tablet 3  . sildenafil (REVATIO) 20 MG tablet 1-5 tabs po qd prn.  Take 30-60 min prior to intercourse 100 tablet 6   No facility-administered medications prior to visit.    Allergies  Allergen Reactions  . Ace Inhibitors     Cough  . Penicillins Rash    ROS Review of Systems  Constitutional: Negative for appetite change, chills, fatigue and fever.  HENT: Negative for congestion, dental problem, ear pain and sore throat.   Eyes: Negative for discharge, redness and visual disturbance.  Respiratory: Negative for cough, chest tightness, shortness of breath and wheezing.   Cardiovascular: Negative for chest pain, palpitations and leg swelling.  Gastrointestinal: Negative for abdominal pain, blood in stool, diarrhea, nausea and vomiting.  Genitourinary: Negative for difficulty urinating, dysuria, flank pain, frequency, hematuria and urgency.  Musculoskeletal: Negative for arthralgias, back pain, joint swelling, myalgias and neck stiffness.  Skin: Negative for pallor and rash.  Neurological: Positive for light-headedness. Negative for dizziness, speech difficulty, weakness and headaches.  Hematological: Negative for adenopathy. Does not bruise/bleed easily.   Psychiatric/Behavioral: Negative for confusion and sleep disturbance. The patient is not nervous/anxious.     PE; Vitals with BMI 11/09/2020 07/08/2020 05/03/2020  Height 5' 6.5" 5\' 7"  5\' 7"   Weight 175 lbs 10 oz 173 lbs 3 oz 168 lbs 10 oz  BMI 27.92 [id number]  Systolic [id number]  Diastolic 78 68 74  Pulse 64 68 71    Gen: Alert, well appearing.  Patient is oriented to person, place, time, and situation. AFFECT: pleasant, lucid thought and speech. ENT: Ears: EACs clear, normal epithelium.  TMs with good  light reflex and landmarks bilaterally.  Eyes: no injection, icteris, swelling, or exudate.  EOMI, PERRLA. Nose: no drainage or turbinate edema/swelling.  No injection or focal lesion.  Mouth: lips without lesion/swelling.  Oral mucosa pink and moist.  Dentition intact and without obvious caries or gingival swelling.  Oropharynx without erythema, exudate, or swelling.  Neck: supple/nontender.  No LAD, mass, or TM.  Carotid pulses 2+ bilaterally, without bruits. CV: RRR, no m/r/g.   LUNGS: CTA bilat, nonlabored resps, good aeration in all lung fields. ABD: soft, NT, ND, BS normal.  No hepatospenomegaly or mass.  No bruits. EXT: no clubbing, cyanosis, or edema.  Musculoskeletal: no joint swelling, erythema, warmth, or tenderness.  ROM of all joints intact. Skin - no sores or suspicious lesions or rashes or color changes   Pertinent labs:  Lab Results  Component Value Date   TSH 1.78 06/27/2018   Lab Results  Component Value Date   WBC 5.0 06/27/2018   HGB 14.6 06/27/2018   HCT 42.9 06/27/2018   MCV 91.8 06/27/2018   PLT 173.0 06/27/2018   Lab Results  Component Value Date   CREATININE 0.85 04/01/2020   BUN 16 04/01/2020   NA 137 04/01/2020   K 4.3 04/01/2020   CL 102 04/01/2020   CO2 29 04/01/2020   Lab Results  Component Value Date   ALT 25 04/01/2020   AST 22 04/01/2020   ALKPHOS 53 04/01/2020   BILITOT 0.7 04/01/2020   Lab Results  Component Value Date   CHOL  176 04/01/2020   Lab Results  Component Value Date   HDL 59.50 04/01/2020   Lab Results  Component Value Date   LDLCALC 96 04/01/2020   Lab Results  Component Value Date   TRIG 102.0 04/01/2020   Lab Results  Component Value Date   CHOLHDL 3 04/01/2020   Lab Results  Component Value Date   PSA 0.77 06/27/2018   Lab Results  Component Value Date   HGBA1C 5.9 04/01/2020   ASSESSMENT AND PLAN:   1) HTN: needs home bp monitoring.  Cont losartan 50mg  qd.  In light of recent orthostatic dizziness and inadequate clear fluid intake I'll hold off on any inc in losartan today. Check bp/hr daily and review #s in 1 mo. Lytes/cr today.  2) HLD: tolerating statin. Cont prava 40mg  qd. Lipids and hepatic panel today.  3) Prediabetes: continue working on TLC. A1c and lytes/cr today.  4) orthostatic dizziness: last couple days. Discussed cutting out colas/beer and increasing water intake.  5) Health maintenance exam: Reviewed age and gender appropriate health maintenance issues (prudent diet, regular exercise, health risks of tobacco and excessive alcohol, use of seatbelts, fire alarms in home, use of sunscreen).  Also reviewed age and gender appropriate health screening as well as vaccine recommendations. Vaccines: Pneumovax due->given today.  Prevnar 13 in 1 yr.  Flu->UTD.  Covid->UTD.  Tdap UTD.  He has had shingrix #1 was 06/27/18 but has not had  #2->rx sent to pharmacy today. Labs: fasting HP, a1c, PSA. Prostate ca screening: PSA today. Colon ca screening:  Hx of polyps, recall 04/2022. Lung ca screening: 50 pack-yr smoking hx, quit 2018-->ordered lung ca screening CT today.  An After Visit Summary was printed and given to the patient.  FOLLOW UP:  Return in about 4 weeks (around 12/07/2020) for f/u bp's and orthostatic dizziness.  Signed:  Crissie Sickles, MD           11/09/2020

## 2020-11-16 ENCOUNTER — Other Ambulatory Visit: Payer: Self-pay

## 2020-11-16 ENCOUNTER — Ambulatory Visit (HOSPITAL_BASED_OUTPATIENT_CLINIC_OR_DEPARTMENT_OTHER)
Admission: RE | Admit: 2020-11-16 | Discharge: 2020-11-16 | Disposition: A | Discharge status: Home / Self Care | Payer: Medicare Other | Source: Ambulatory Visit | Attending: Family Medicine | Admitting: Family Medicine

## 2020-11-16 DIAGNOSIS — Z122 Encounter for screening for malignant neoplasm of respiratory organs: Secondary | ICD-10-CM | POA: Insufficient documentation

## 2020-11-16 DIAGNOSIS — I2510 Atherosclerotic heart disease of native coronary artery without angina pectoris: Secondary | ICD-10-CM | POA: Diagnosis not present

## 2020-11-16 DIAGNOSIS — F17210 Nicotine dependence, cigarettes, uncomplicated: Secondary | ICD-10-CM | POA: Insufficient documentation

## 2020-11-16 DIAGNOSIS — I700 Atherosclerosis of aorta: Secondary | ICD-10-CM | POA: Insufficient documentation

## 2020-11-16 DIAGNOSIS — J432 Centrilobular emphysema: Secondary | ICD-10-CM | POA: Diagnosis not present

## 2020-11-17 ENCOUNTER — Encounter: Payer: Self-pay | Admitting: Family Medicine

## 2020-12-15 ENCOUNTER — Other Ambulatory Visit: Payer: Self-pay | Admitting: Family Medicine

## 2021-01-31 ENCOUNTER — Other Ambulatory Visit: Payer: Self-pay | Admitting: Family Medicine

## 2021-02-08 NOTE — Progress Notes (Signed)
OFFICE VISIT  02/09/2021  CC:  Chief Complaint  Patient presents with  . Follow-up    Hypertension, fasting   HPI:    Patient is a 65 y.o. Caucasian male who presents for 3 mo f/u HTN. A/P as of last visit: "1) HTN: needs home bp monitoring.  Cont losartan 50mg  qd.  In light of recent orthostatic dizziness and inadequate clear fluid intake I'll hold off on any inc in losartan today. Check bp/hr daily and review #s in 1 mo. Lytes/cr today.  2) HLD: tolerating statin. Cont prava 40mg  qd. Lipids and hepatic panel today.  3) Prediabetes: continue working on TLC. A1c and lytes/cr today.  4) orthostatic dizziness: last couple days. Discussed cutting out colas/beer and increasing water intake.  5) Health maintenance exam: Reviewed age and gender appropriate health maintenance issues (prudent diet, regular exercise, health risks of tobacco and excessive alcohol, use of seatbelts, fire alarms in home, use of sunscreen).  Also reviewed age and gender appropriate health screening as well as vaccine recommendations. Vaccines: Pneumovax due->given today.  Prevnar 13 in 1 yr.  Flu->UTD.  Covid->UTD.  Tdap UTD.  He has had shingrix #1 was 06/27/18 but has not had  #2->rx sent to pharmacy today. Labs: fasting HP, a1c, PSA. Prostate ca screening: PSA today. Colon ca screening:  Hx of polyps, recall 04/2022. Lung ca screening: 50 pack-yr smoking hx, quit 2018-->ordered lung ca screening CT today."  INTERIM HX: Overall seems to be feeling fine lately.  HTN: taking 50mg  losartan qd. No home bp monitoring. Always active physically and having no dizziness.  No CP or SOB.  HLD: LDL was up from 96 to 122 last check 3 mo ago at which time we chose to continue him on pravastatin 40mg  qd.   With the atherosclerotic calcifications noted on coronaries on his lung cancer screening CT 3 mo ago an argument could be made that his LDL goal should be <70.  GAD: he ran out of lexapro 2 mo ago or so and did  not request more, says he does feel more anxious and not thinking as clearly since off this med.  More irritability, not concentrating as well.  Denies depressed mood.  No panic. He had been doing well on lexapro 10mg  qd for at least a 6 mo period prior to running out.  ROS as above, plus--> no fevers, no CP, no SOB, no wheezing, no cough, no dizziness, no HAs, no rashes, no melena/hematochezia.  No polyuria or polydipsia.  No myalgias or arthralgias.  No focal weakness, paresthesias, or tremors.  No acute vision or hearing abnormalities.  No dysuria or unusual/new urinary urgency or frequency.  No recent changes in lower legs. No n/v/d or abd pain.  No palpitations.      Past Medical History:  Diagnosis Date  . COPD (chronic obstructive pulmonary disease) ([place])    noted on lung ca screening CT 2022  . Diverticulitis   . ED (erectile dysfunction)   . Elevated ferritin   . Environmental allergies    mild from pollen   . History of adenomatous polyp of colon 05/06/2019   Recall 5 yrs.  . Hyperlipidemia   . Hypertension   . Neuromuscular disorder ([facility])    hands cramping  . Nicotine dependence, cigarettes, in remission   . Obesity, Class I, BMI 30-34.9   . Prediabetes    A1c 5.9% 2019.  a1c 6.2% Feb 2020.  [facility] Screening for lung cancer 11/2020   11/2020 NEG. Rpt  approx 11/2021.  . Tobacco dependence   . Vitamin D deficiency     Past Surgical History:  Procedure Laterality Date  . COLON SURGERY  2005   Partial resection (approx 1 foot per pt) for diverticulitis  . COLONOSCOPY  12/18/2008;05/06/19   2020->adenoma->recall 3 yrs per pt.  [facility] VASECTOMY      Outpatient Medications Prior to Visit  Medication Sig Dispense Refill  . Biotin 10000 MCG TABS Take 1 capsule by mouth daily.    . Cholecalciferol (VITAMIN D3) 5000 units CAPS Take 1 capsule by mouth daily.    [facility] Kitchen FIBER PO Take 3 capsules by mouth daily.    . Multiple Vitamin (MULTIVITAMIN) tablet Take 1 tablet by mouth daily.    [facility] Kitchen  omega-3 fish oil (MAXEPA) 1000 MG CAPS capsule Take 1 capsule by mouth 2 (two) times daily.     . pravastatin (PRAVACHOL) 40 MG tablet 1 tab po qd 90 tablet 3  . sildenafil (REVATIO) 20 MG tablet 1-5 tabs po qd prn.  Take 30-60 min prior to intercourse 100 tablet 6  . escitalopram (LEXAPRO) 10 MG tablet Take 1 tablet (10 mg total) by mouth daily. 90 tablet 1  . losartan (COZAAR) 50 MG tablet TAKE 1 TABLET BY MOUTH EVERY DAY 14 tablet 0  . ibuprofen (ADVIL,MOTRIN) 200 MG tablet Take 1 tablet by mouth every 6 (six) hours as needed. (Patient not taking: Reported on 02/09/2021)     No facility-administered medications prior to visit.    Allergies  Allergen Reactions  . Ace Inhibitors     Cough  . Penicillins Rash    ROS As per HPI  PE: Vitals with BMI 02/09/2021 11/09/2020 07/08/2020  Height 5' 6.5" 5' 6.5" 5\' 7"   Weight 179 lbs 6 oz 175 lbs 10 oz 173 lbs 3 oz  BMI 28.53 69.48 54.62  Systolic 703 500 938  Diastolic 67 78 68  Pulse 74 64 68     Gen: Alert, well appearing.  Patient is oriented to person, place, time, and situation. AFFECT: pleasant, lucid thought and speech. CV: RRR, no m/r/g.   LUNGS: CTA bilat, nonlabored resps, good aeration in all lung fields. EXT: no clubbing or cyanosis.  no edema.    LABS:  Lab Results  Component Value Date   TSH 1.16 11/09/2020   Lab Results  Component Value Date   WBC 5.4 11/09/2020   HGB 14.7 11/09/2020   HCT 43.1 11/09/2020   MCV 94.1 11/09/2020   PLT 158.0 11/09/2020   Lab Results  Component Value Date   CREATININE 0.86 11/09/2020   BUN 18 11/09/2020   NA 139 11/09/2020   K 4.2 11/09/2020   CL 103 11/09/2020   CO2 29 11/09/2020   Lab Results  Component Value Date   ALT 22 11/09/2020   AST 23 11/09/2020   ALKPHOS 46 11/09/2020   BILITOT 0.8 11/09/2020   Lab Results  Component Value Date   CHOL 199 11/09/2020   Lab Results  Component Value Date   HDL 53.00 11/09/2020   Lab Results  Component Value Date    LDLCALC 122 (H) 11/09/2020   Lab Results  Component Value Date   TRIG 119.0 11/09/2020   Lab Results  Component Value Date   CHOLHDL 4 11/09/2020   Lab Results  Component Value Date   PSA 0.59 11/09/2020   PSA 0.77 06/27/2018   Lab Results  Component Value Date   HGBA1C 5.8 11/09/2020   IMPRESSION AND PLAN:  1) HTN, well controlled. Cont losartan 50mg  qd. Lytes/cr today.  2) HLD: LDL goal <70. LDL was 122 last check 3 mo ago on pravastatin 40mg  qd. FLP and hepatic panel today.  3) GAD: responded well to lexapro 10mg  qd but ran out 2 mo ago. Needs to restart this med so I rx'd it today.  An After Visit Summary was printed and given to the patient.  FOLLOW UP: Return in about 6 months (around 08/11/2021) for routine chronic illness f/u.  Signed:  Crissie Sickles, MD           02/09/2021

## 2021-02-09 ENCOUNTER — Other Ambulatory Visit: Payer: Self-pay | Admitting: Family Medicine

## 2021-02-09 ENCOUNTER — Ambulatory Visit (INDEPENDENT_AMBULATORY_CARE_PROVIDER_SITE_OTHER): Payer: Medicare Other | Admitting: Family Medicine

## 2021-02-09 ENCOUNTER — Encounter: Payer: Self-pay | Admitting: Family Medicine

## 2021-02-09 ENCOUNTER — Other Ambulatory Visit: Payer: Self-pay

## 2021-02-09 VITALS — BP 115/67 | HR 74 | Temp 98.0°F | Resp 16 | Ht 66.5 in | Wt 179.4 lb

## 2021-02-09 DIAGNOSIS — F411 Generalized anxiety disorder: Secondary | ICD-10-CM | POA: Diagnosis not present

## 2021-02-09 DIAGNOSIS — R7303 Prediabetes: Secondary | ICD-10-CM | POA: Diagnosis not present

## 2021-02-09 DIAGNOSIS — E7800 Pure hypercholesterolemia, unspecified: Secondary | ICD-10-CM

## 2021-02-09 DIAGNOSIS — I10 Essential (primary) hypertension: Secondary | ICD-10-CM

## 2021-02-09 LAB — COMPREHENSIVE METABOLIC PANEL
ALT: 17 U/L (ref 0–53)
AST: 18 U/L (ref 0–37)
Albumin: 4.5 g/dL (ref 3.5–5.2)
Alkaline Phosphatase: 46 U/L (ref 39–117)
BUN: 16 mg/dL (ref 6–23)
CO2: 30 mEq/L (ref 19–32)
Calcium: 9.3 mg/dL (ref 8.4–10.5)
Chloride: 102 mEq/L (ref 96–112)
Creatinine, Ser: 0.89 mg/dL (ref 0.40–1.50)
GFR: 89.78 mL/min (ref >60.00)
Glucose, Bld: 103 mg/dL — ABNORMAL HIGH (ref 70–99)
Potassium: 4.3 mEq/L (ref 3.5–5.1)
Sodium: 139 mEq/L (ref 135–145)
Total Bilirubin: 0.8 mg/dL (ref 0.2–1.2)
Total Protein: 6.7 g/dL (ref 6.0–8.3)

## 2021-02-09 LAB — LIPID PANEL
Cholesterol: 165 mg/dL (ref 0–200)
HDL: 51.1 mg/dL (ref >39.00)
LDL Cholesterol: 99 mg/dL (ref 0–99)
NonHDL: 114.29
Total CHOL/HDL Ratio: 3
Triglycerides: 74 mg/dL (ref 0.0–149.0)
VLDL: 14.8 mg/dL (ref 0.0–40.0)

## 2021-02-09 MED ORDER — ESCITALOPRAM OXALATE 10 MG PO TABS: 10.0000 mg | ORAL_TABLET | Freq: Every day | ORAL | 3 refills | Status: AC

## 2021-02-09 MED ORDER — PRAVASTATIN SODIUM 40 MG PO TABS: ORAL_TABLET | ORAL | 3 refills | Status: DC

## 2021-02-09 MED ORDER — LOSARTAN POTASSIUM 50 MG PO TABS: 1.0000 | ORAL_TABLET | Freq: Every day | ORAL | 3 refills | Status: AC

## 2021-02-10 NOTE — Telephone Encounter (Signed)
Rx sent yesterday for 40mg  tab bid but insurance will only cover 1 tab daily. Pharmacy requesting change to 80mg  tab instead.  Medication pending. Please advise if change appropriate

## 2021-02-10 NOTE — Telephone Encounter (Signed)
OK, pravastatin 80mg  rx'd.

## 2021-03-11 ENCOUNTER — Other Ambulatory Visit: Payer: Self-pay | Admitting: Family Medicine

## 2021-05-17 ENCOUNTER — Other Ambulatory Visit: Payer: Self-pay | Admitting: Family Medicine

## 2021-07-19 ENCOUNTER — Other Ambulatory Visit: Payer: Self-pay

## 2021-07-19 ENCOUNTER — Other Ambulatory Visit: Payer: Self-pay | Admitting: Family Medicine

## 2021-07-19 MED ORDER — PRAVASTATIN SODIUM 80 MG PO TABS: ORAL_TABLET | ORAL | 0 refills | Status: AC

## 2021-08-16 ENCOUNTER — Other Ambulatory Visit: Payer: Self-pay | Admitting: Family Medicine

## 2021-09-02 ENCOUNTER — Other Ambulatory Visit: Payer: Self-pay | Admitting: Family Medicine

## 2021-10-23 IMAGING — CT CT CHEST LUNG CANCER SCREENING LOW DOSE W/O CM
1 series · 10 of 10 positions shown, 13 images · non-contrast
Comparison: No priors.

CLINICAL DATA: 64-year-old male current smoker with 50 pack-year
history of smoking. Lung cancer screening examination.

EXAM:
CT CHEST WITHOUT CONTRAST LOW-DOSE FOR LUNG CANCER SCREENING
TECHNIQUE: Multidetector CT imaging of the chest was performed following the
standard protocol without IV contrast.

[ct lung segmentation data · axial · 0.77mm/px · z∈[-361,-361]mm · 10 of 296 frames shown]
[frame 1/296  mediastinal]
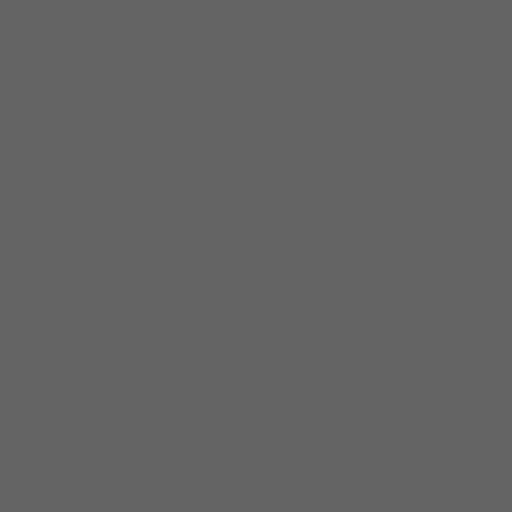
[frame 1/296  lung]
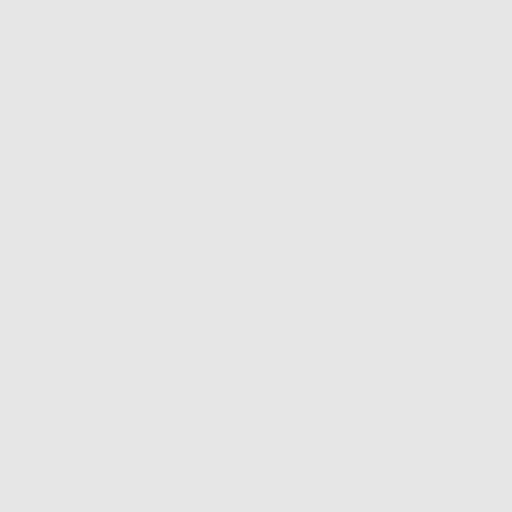
[frame 33/296  lung]
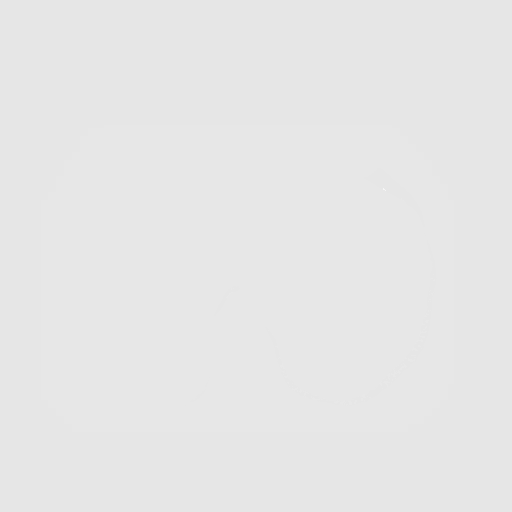
[frame 66/296  lung]
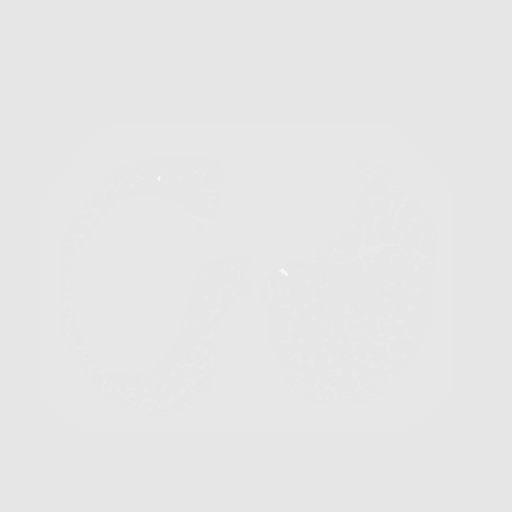
[frame 99/296  lung]
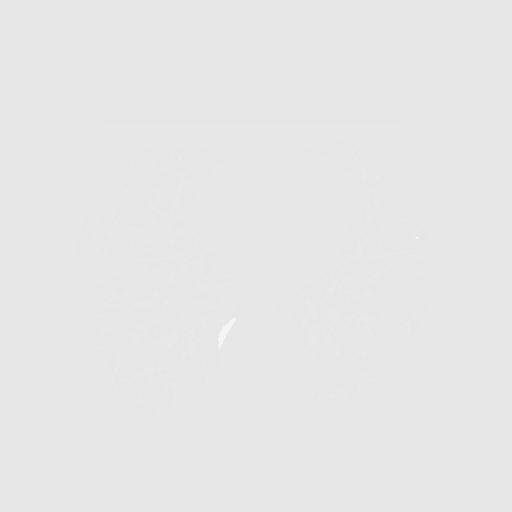
[frame 132/296  mediastinal]
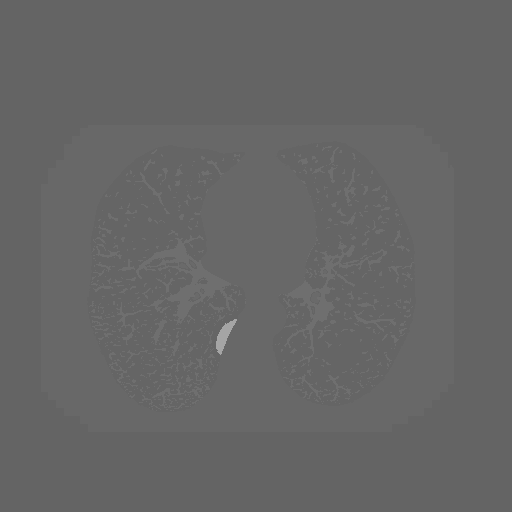
[frame 132/296  lung]
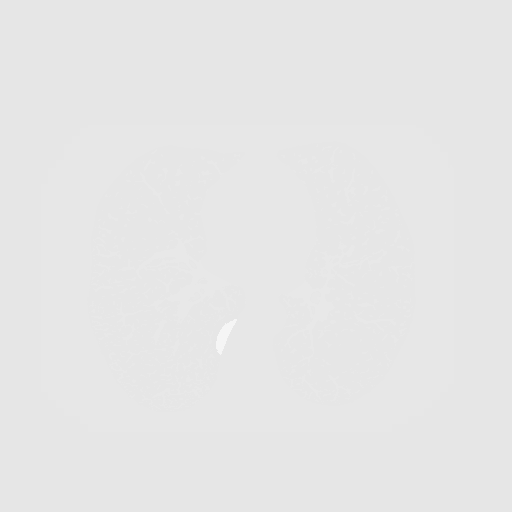
[frame 164/296  lung]
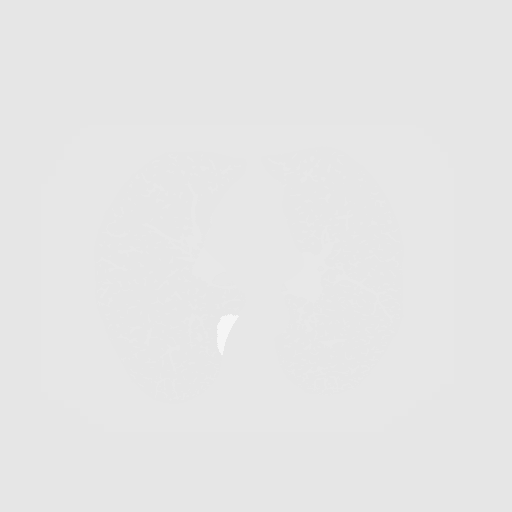
[frame 197/296  lung]
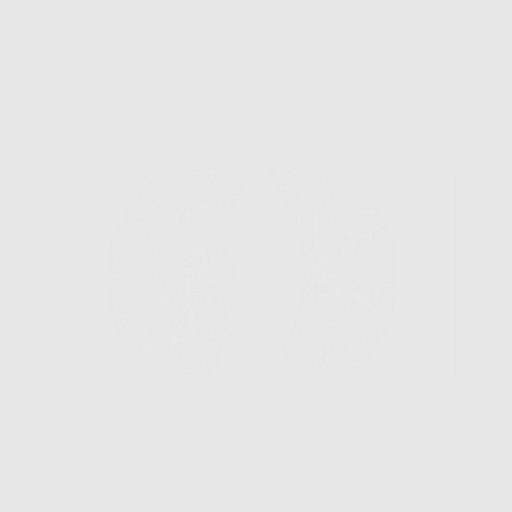
[frame 230/296  lung]
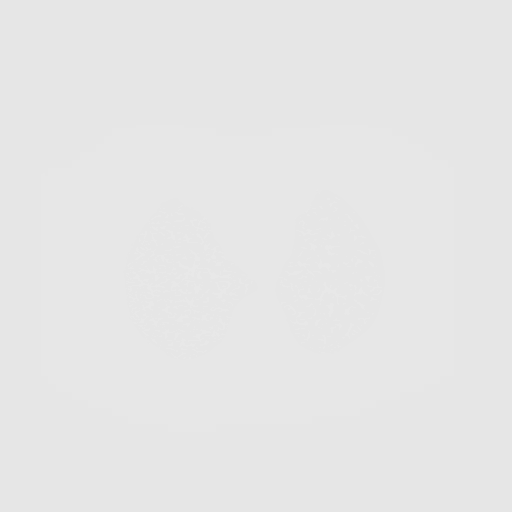
[frame 263/296  mediastinal]
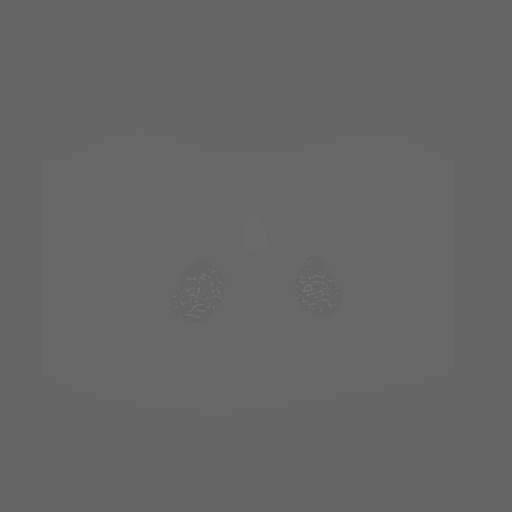
[frame 263/296  lung]
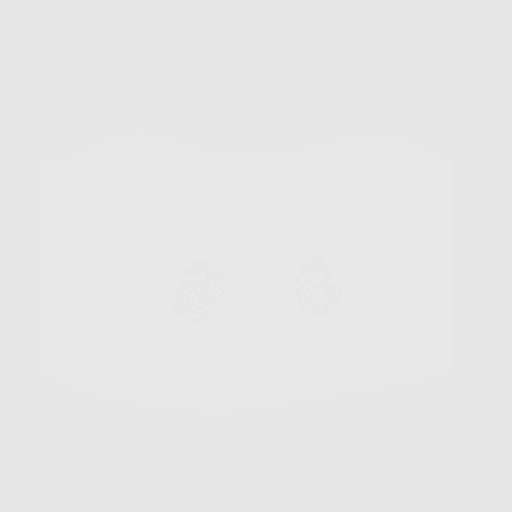
[frame 296/296  lung]
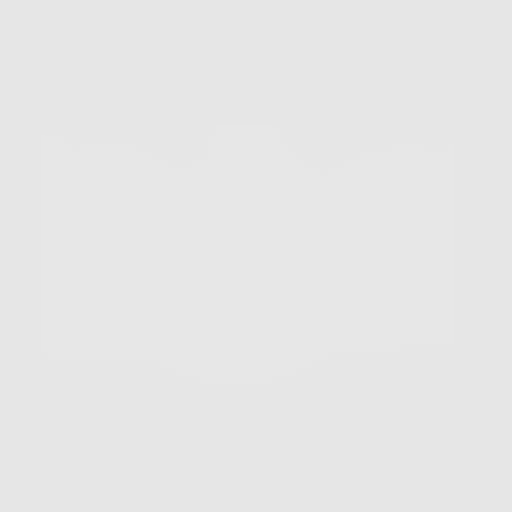

[10 of 10 positions shown; findings below may reference images not displayed]

FINDINGS: Cardiovascular: Heart size is normal. There is no significant
pericardial fluid, thickening or pericardial calcification. There is
aortic atherosclerosis, as well as atherosclerosis of the great
vessels of the mediastinum and the coronary arteries, including
calcified atherosclerotic plaque in the left anterior descending,
left circumflex and right coronary arteries. Mild calcifications of
the aortic valve.

Mediastinum/Nodes: No pathologically enlarged mediastinal or hilar
lymph nodes. Please note that accurate exclusion of hilar adenopathy
is limited on noncontrast CT scans. Esophagus is unremarkable in
appearance. No axillary lymphadenopathy.

Lungs/Pleura: Tiny pulmonary nodules are noted in the right upper
lobe associated with the minor fissure, largest of which is on axial
image 180 of series 3, with a volume derived mean diameter of only
3.1 mm. No other larger more suspicious appearing pulmonary nodules
or masses are noted. No acute consolidative airspace disease. No
pleural effusions. Diffuse bronchial wall thickening with mild
centrilobular and paraseptal emphysema.

Upper Abdomen: Unremarkable.

Musculoskeletal: There are no aggressive appearing lytic or blastic
lesions noted in the visualized portions of the skeleton.
IMPRESSION: 1. Lung-RADS 2S, benign appearance or behavior. Continue annual
screening with low-dose chest CT without contrast in 12 months.
2. The "S" modifier above refers to potentially clinically
significant non lung cancer related findings. Specifically, there is
aortic atherosclerosis, in addition to 3 vessel coronary artery
disease. Please note that although the presence of coronary artery
calcium documents the presence of coronary artery disease, the
severity of this disease and any potential stenosis cannot be
assessed on this non-gated CT examination. Assessment for potential
risk factor modification, dietary therapy or pharmacologic therapy
may be warranted, if clinically indicated.
3. Mild diffuse bronchial wall thickening with mild centrilobular
and paraseptal emphysema; imaging findings suggestive of underlying
COPD.
4. There are mild calcifications of the aortic valve.
Echocardiographic correlation for evaluation of potential valvular
dysfunction may be warranted if clinically indicated.

Aortic Atherosclerosis (Q0NDC-37R.R) and Emphysema ([id number].M).

## 2022-01-12 ENCOUNTER — Other Ambulatory Visit: Payer: Self-pay | Admitting: Family Medicine

## 2022-05-29 ENCOUNTER — Encounter: Payer: Self-pay | Admitting: Gastroenterology
